# Patient Record
Sex: Female | Born: 1985 | Race: White | Hispanic: No | Marital: Married | Smoking: Never smoker
Health system: Southern US, Community
[De-identification: ages and names within clinical notes are randomized; demographics above are authoritative.]

## PROBLEM LIST (undated history)

## (undated) ENCOUNTER — Inpatient Hospital Stay (HOSPITAL_COMMUNITY): Payer: Self-pay

## (undated) DIAGNOSIS — N979 Female infertility, unspecified: Secondary | ICD-10-CM

## (undated) DIAGNOSIS — N83209 Unspecified ovarian cyst, unspecified side: Secondary | ICD-10-CM

## (undated) DIAGNOSIS — C439 Malignant melanoma of skin, unspecified: Secondary | ICD-10-CM

## (undated) DIAGNOSIS — R87629 Unspecified abnormal cytological findings in specimens from vagina: Secondary | ICD-10-CM

## (undated) DIAGNOSIS — T7840XA Allergy, unspecified, initial encounter: Secondary | ICD-10-CM

## (undated) HISTORY — DX: Allergy, unspecified, initial encounter: T78.40XA

## (undated) HISTORY — DX: Malignant melanoma of skin, unspecified: C43.9

## (undated) HISTORY — PX: NO PAST SURGERIES: SHX2092

---

## 2007-12-24 ENCOUNTER — Emergency Department (HOSPITAL_COMMUNITY): Admission: EM | Admit: 2007-12-24 | Discharge: 2007-12-25 | Payer: Self-pay | Admitting: Emergency Medicine

## 2012-06-29 ENCOUNTER — Ambulatory Visit (INDEPENDENT_AMBULATORY_CARE_PROVIDER_SITE_OTHER): Payer: BC Managed Care – PPO | Admitting: Physician Assistant

## 2012-06-29 VITALS — BP 132/86 | HR 103 | Temp 98.7°F | Resp 16 | Ht 65.0 in | Wt 231.8 lb

## 2012-06-29 DIAGNOSIS — J029 Acute pharyngitis, unspecified: Secondary | ICD-10-CM

## 2012-06-29 DIAGNOSIS — J020 Streptococcal pharyngitis: Secondary | ICD-10-CM

## 2012-06-29 DIAGNOSIS — J309 Allergic rhinitis, unspecified: Secondary | ICD-10-CM

## 2012-06-29 MED ORDER — AMOXICILLIN 875 MG PO TABS: 875.0000 mg | ORAL_TABLET | Freq: Two times a day (BID) | ORAL | Status: DC

## 2012-06-29 NOTE — Patient Instructions (Addendum)
Ibuprofen 800 mg three times daily with food and/or Acetaminophen 325 mg every 4-6 hours can help with the muscle aches and throat pain. You are contagious until you have been on the medication for 24 hours.  Please take two doses today. Get plenty of rest and drink at least 64 ounces of water daily.

## 2012-06-29 NOTE — Progress Notes (Signed)
  Subjective:    Patient ID: Dana Hatfield, female    DOB: 04/06/1985, 27 y.o.   MRN: [id number]  HPI This 27 y.o. female presents for evaluation of respiratory illness.  She started with a scratchy throat, but has progressed to very sore throat.  Cough is non-productive, and makes the throat pain worse.  Nasal/sinus congestions, pressure and drainge.  Subjective fever, chills.  No GI/GU symptoms.  Feels achey all over. Nasal drainage is clear.   Past Medical History  Diagnosis Date  . Allergy     History reviewed. No pertinent past surgical history.  Prior to Admission medications   Medication Sig Start Date End Date Taking? Authorizing Provider  Norgestimate-Ethinyl Estradiol Triphasic (ORTHO TRI-CYCLEN, 28,) 0.18/0.215/0.25 MG-35 MCG tablet Take 1 tablet by mouth daily.   Yes Historical Provider, MD    No Known Allergies  History   Social History  . Marital Status: Single    Spouse Name: n/a    Number of Children: 0  . Years of Education: 16   Occupational History  . teacher    Social History Main Topics  . Smoking status: Never Smoker   . Smokeless tobacco: Not on file  . Alcohol Use: No  . Drug Use: No  . Sexually Active: Yes    Birth Control/ Protection: Pill    Family History  Problem Relation Age of Onset  . Parkinson's disease Mother   . Arthritis/Rheumatoid Mother   . Ovarian cancer Maternal Grandmother   . Ovarian cancer Paternal Grandmother       Review of Systems As above.    Objective:   Physical Exam  Blood pressure 132/86, pulse 103, temperature 98.7 F (37.1 C), temperature source Oral, resp. rate 16, height 5\' 5"  (1.651 m), weight 231 lb 12.8 oz (105.144 kg), last menstrual period 06/16/2012, SpO2 100.00%. Body mass index is 38.57 kg/(m^2). Well-developed, well nourished WF who is awake, alert and oriented, in NAD. HEENT: [place]/AT, PERRL, EOMI.  Sclera and conjunctiva are clear.  EAC are patent, TMs are normal in appearance. Nasal mucosa is  pink and moist, though a little congested.  Clear rhinorrhea noted. OP is erythematous, but otherwise clear. Neck: supple, thyromegaly. Tender shotty lymphadenopathy. Heart: RRR, no murmur Lungs: normal effort, CTA Extremities: no cyanosis, clubbing or edema. Skin: warm and dry without rash. Psychologic: good mood and appropriate affect, normal speech and behavior.  Results for orders placed in visit on 06/29/12  POCT RAPID STREP A (OFFICE)      Result Value Range   Rapid Strep A Screen Positive (*) Negative      Assessment & Plan:  Acute pharyngitis - Plan: POCT rapid strep A  Allergic rhinitis  Strep pharyngitis - Plan: amoxicillin (AMOXIL) 875 MG tablet  Fernande Bras, PA-C Physician Assistant-Certified [facility]

## 2014-03-13 DIAGNOSIS — C439 Malignant melanoma of skin, unspecified: Secondary | ICD-10-CM

## 2014-03-13 HISTORY — DX: Malignant melanoma of skin, unspecified: C43.9

## 2014-08-17 ENCOUNTER — Other Ambulatory Visit (HOSPITAL_COMMUNITY): Payer: Self-pay | Admitting: Gynecology

## 2014-08-17 DIAGNOSIS — Z3141 Encounter for fertility testing: Secondary | ICD-10-CM

## 2014-08-25 ENCOUNTER — Ambulatory Visit (HOSPITAL_COMMUNITY)
Admission: RE | Admit: 2014-08-25 | Discharge: 2014-08-25 | Disposition: A | Discharge status: Home / Self Care | Payer: BC Managed Care – PPO | Source: Ambulatory Visit | Attending: Gynecology | Admitting: Gynecology

## 2014-08-25 DIAGNOSIS — Z3141 Encounter for fertility testing: Secondary | ICD-10-CM

## 2014-08-25 DIAGNOSIS — N979 Female infertility, unspecified: Secondary | ICD-10-CM | POA: Diagnosis not present

## 2014-08-25 MED ORDER — IOHEXOL 300 MG/ML  SOLN
20.0000 mL | Freq: Once | INTRAMUSCULAR | Status: AC | PRN
  Administered 2014-08-25: 20 mL

## 2014-08-25 MED ORDER — IOHEXOL 300 MG/ML  SOLN
30.0000 mL | Freq: Once | INTRAMUSCULAR | Status: AC | PRN
  Administered 2014-08-25: 30 mL

## 2014-11-01 ENCOUNTER — Ambulatory Visit
Admission: EM | Admit: 2014-11-01 | Discharge: 2014-11-01 | Disposition: A | Discharge status: Home / Self Care | Payer: BC Managed Care – PPO | Attending: Family Medicine | Admitting: Family Medicine

## 2014-11-01 ENCOUNTER — Encounter: Payer: Self-pay | Admitting: Gynecology

## 2014-11-01 DIAGNOSIS — M62830 Muscle spasm of back: Secondary | ICD-10-CM

## 2014-11-01 DIAGNOSIS — S39012A Strain of muscle, fascia and tendon of lower back, initial encounter: Secondary | ICD-10-CM

## 2014-11-01 MED ORDER — NAPROXEN 500 MG PO TABS: 500.0000 mg | ORAL_TABLET | Freq: Two times a day (BID) | ORAL | Status: DC

## 2014-11-01 MED ORDER — CYCLOBENZAPRINE HCL 5 MG PO TABS: 5.0000 mg | ORAL_TABLET | Freq: Two times a day (BID) | ORAL | Status: DC | PRN

## 2014-11-01 NOTE — ED Notes (Signed)
Patient stated x this am while putting clothes in laundry basket twisted lower back and now extremely painful.

## 2014-11-01 NOTE — ED Provider Notes (Signed)
Patient presents today with symptoms of lower back pain. She states that she was putting clothes in laundry basket when she twisted her lower back. She has had this happen before but has usually resolved with Tylenol and time. She took 2 Tylenol after it happened but her symptoms have persisted. She denies any radicular symptoms, incontinence, foot drop, weakness of lower extremities. She denies any known history of any discogenic problems.  ROS: Negative except mentioned above. Vitals as per Epic.   GENERAL: NAD RESP: CTA B CARD: RRR MSK: no midline tenderness, mild generalized lumbar paravertebral tenderness bilaterally, decreased ROM in all directions, -SLR, 5/5 strength of LEs, nv intact. NEURO: CN II-XII groslly intact   A/P: Lower Back Strain/Spasm-will treat patient with Naprosyn, Flexeril, ice when necessary, if symptoms do persist or worsen I would recommend that patient seek medical attention for imaging, etc. Patient addresses understanding of plan. My of instructed her not to do any heavy lifting or any repetitive motions with her back.   Paulina Fusi, MD 11/01/14 [phone]

## 2015-01-12 LAB — OB RESULTS CONSOLE ABO/RH: RH Type: POSITIVE

## 2015-01-12 LAB — OB RESULTS CONSOLE GBS: STREP GROUP B AG: NEGATIVE

## 2015-01-12 LAB — OB RESULTS CONSOLE HEPATITIS B SURFACE ANTIGEN: Hepatitis B Surface Ag: NEGATIVE

## 2015-01-12 LAB — OB RESULTS CONSOLE GC/CHLAMYDIA
Chlamydia: NEGATIVE
GC PROBE AMP, GENITAL: NEGATIVE

## 2015-01-12 LAB — OB RESULTS CONSOLE ANTIBODY SCREEN: Antibody Screen: NEGATIVE

## 2015-01-12 LAB — OB RESULTS CONSOLE HIV ANTIBODY (ROUTINE TESTING): HIV: NONREACTIVE

## 2015-01-12 LAB — OB RESULTS CONSOLE RPR: RPR: NONREACTIVE

## 2015-01-12 LAB — OB RESULTS CONSOLE RUBELLA ANTIBODY, IGM: Rubella: IMMUNE

## 2015-03-14 NOTE — L&D Delivery Note (Signed)
SVD of VFI at 1935 on 08/10/15.  APGARs 9,9.  EBL 350cc.  Placenta to pathology.   Head delivered ROA and body followed atraumatically.  Baby to abdomen.  Cord was clamped and cut.  Cord blood was obtained.  Placenta delivered S/I/3VC.  Fundus was firmed with pitocin and massage.  Bilateral vaginal sulcus lacs were repaired with 3-0 Rapide in the normal fashion.  2nd degree perineal and right periurethral lacs were repaired with 3-0 Rapide in the normal fashion.  Mom and baby stable.   Linda Hedges, DO

## 2015-05-25 ENCOUNTER — Other Ambulatory Visit (HOSPITAL_COMMUNITY): Payer: Self-pay | Admitting: Obstetrics & Gynecology

## 2015-05-25 DIAGNOSIS — Z0489 Encounter for examination and observation for other specified reasons: Secondary | ICD-10-CM

## 2015-05-25 DIAGNOSIS — Z3A29 29 weeks gestation of pregnancy: Secondary | ICD-10-CM

## 2015-05-25 DIAGNOSIS — IMO0002 Reserved for concepts with insufficient information to code with codable children: Secondary | ICD-10-CM

## 2015-05-26 ENCOUNTER — Ambulatory Visit (HOSPITAL_COMMUNITY): Payer: BC Managed Care – PPO

## 2015-05-27 ENCOUNTER — Ambulatory Visit (HOSPITAL_COMMUNITY)
Admission: RE | Admit: 2015-05-27 | Discharge: 2015-05-27 | Disposition: A | Discharge status: Home / Self Care | Payer: BC Managed Care – PPO | Source: Ambulatory Visit | Attending: Obstetrics & Gynecology | Admitting: Obstetrics & Gynecology

## 2015-05-27 DIAGNOSIS — Z0489 Encounter for examination and observation for other specified reasons: Secondary | ICD-10-CM

## 2015-05-27 DIAGNOSIS — Z3A29 29 weeks gestation of pregnancy: Secondary | ICD-10-CM

## 2015-05-27 DIAGNOSIS — Z36 Encounter for antenatal screening of mother: Secondary | ICD-10-CM | POA: Diagnosis not present

## 2015-05-27 DIAGNOSIS — IMO0002 Reserved for concepts with insufficient information to code with codable children: Secondary | ICD-10-CM

## 2015-06-04 ENCOUNTER — Other Ambulatory Visit (HOSPITAL_COMMUNITY): Payer: Self-pay

## 2015-06-04 ENCOUNTER — Encounter (HOSPITAL_COMMUNITY): Payer: Self-pay

## 2015-06-29 ENCOUNTER — Inpatient Hospital Stay (HOSPITAL_COMMUNITY)
Admission: AD | Admit: 2015-06-29 | Discharge: 2015-06-29 | Disposition: A | Discharge status: Home / Self Care | Payer: Worker's Compensation | Source: Ambulatory Visit | Attending: Obstetrics and Gynecology | Admitting: Obstetrics and Gynecology

## 2015-06-29 ENCOUNTER — Encounter (HOSPITAL_COMMUNITY): Payer: Self-pay | Admitting: *Deleted

## 2015-06-29 DIAGNOSIS — O9A213 Injury, poisoning and certain other consequences of external causes complicating pregnancy, third trimester: Secondary | ICD-10-CM

## 2015-06-29 DIAGNOSIS — R109 Unspecified abdominal pain: Secondary | ICD-10-CM | POA: Insufficient documentation

## 2015-06-29 DIAGNOSIS — O368130 Decreased fetal movements, third trimester, not applicable or unspecified: Secondary | ICD-10-CM

## 2015-06-29 DIAGNOSIS — O26893 Other specified pregnancy related conditions, third trimester: Secondary | ICD-10-CM | POA: Diagnosis not present

## 2015-06-29 DIAGNOSIS — W500XXA Accidental hit or strike by another person, initial encounter: Secondary | ICD-10-CM | POA: Diagnosis not present

## 2015-06-29 DIAGNOSIS — S3981XA Other specified injuries of abdomen, initial encounter: Secondary | ICD-10-CM | POA: Diagnosis not present

## 2015-06-29 DIAGNOSIS — T1490 Injury, unspecified: Secondary | ICD-10-CM

## 2015-06-29 DIAGNOSIS — Z3A33 33 weeks gestation of pregnancy: Secondary | ICD-10-CM | POA: Diagnosis not present

## 2015-06-29 HISTORY — DX: Unspecified abnormal cytological findings in specimens from vagina: R87.629

## 2015-06-29 HISTORY — DX: Unspecified ovarian cyst, unspecified side: N83.209

## 2015-06-29 HISTORY — DX: Female infertility, unspecified: N97.9

## 2015-06-29 LAB — KLEIHAUER-BETKE STAIN
# Vials RhIg: 1
Fetal Cells %: 0 %
Quantitation Fetal Hemoglobin: 0 mL

## 2015-06-29 MED ORDER — NIFEDIPINE 10 MG PO CAPS
10.0000 mg | ORAL_CAPSULE | Freq: Once | ORAL | Status: AC
  Administered 2015-06-29: 10 mg via ORAL
  Filled 2015-06-29: qty 1

## 2015-06-29 MED ORDER — NIFEDIPINE 10 MG PO CAPS
10.0000 mg | ORAL_CAPSULE | Freq: Once | ORAL | Status: DC
  Filled 2015-06-29: qty 1
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
## 2015-06-29 NOTE — MAU Note (Signed)
Pt back to use restroom, then taken directly to rm
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
## 2015-06-29 NOTE — Discharge Instructions (Signed)
Vaginal Bleeding During Pregnancy, Third Trimester A small amount of bleeding (spotting) from the vagina is relatively common in pregnancy. Various things can cause bleeding or spotting in pregnancy. Sometimes the bleeding is normal and is not a problem. However, bleeding during the third trimester can also be a sign of something serious for the mother and the baby. Be sure to tell your health care provider about any vaginal bleeding right away.  Some possible causes of vaginal bleeding during the third trimester include:   The placenta may be partially or completely covering the opening to the cervix (placenta previa).   The placenta may have separated from the uterus (abruption of the placenta).   There may be an infection or growth on the cervix.   You may be starting labor, called discharging of the mucus plug.   The placenta may grow into the muscle layer of the uterus (placenta accreta).  HOME CARE INSTRUCTIONS  Watch your condition for any changes. The following actions may help to lessen any discomfort you are feeling:   Follow your health care provider's instructions for limiting your activity. If your health care provider orders bed rest, you may need to stay in bed and only get up to use the bathroom. However, your health care provider may allow you to continue light activity.  If needed, make plans for someone to help with your regular activities and responsibilities while you are on bed rest.  Keep track of the number of pads you use each day, how often you change pads, and how soaked (saturated) they are. Write this down.  Do not use tampons. Do not douche.  Do not have sexual intercourse or orgasms until approved by your health care provider.  Follow your health care provider's advice about lifting, driving, and physical activities.  If you pass any tissue from your vagina, save the tissue so you can show it to your health care provider.   Only take over-the-counter  or prescription medicines as directed by your health care provider.  Do not take aspirin because it can make you bleed.   Keep all follow-up appointments as directed by your health care provider. SEEK MEDICAL CARE IF:  You have any vaginal bleeding during any part of your pregnancy.  You have cramps or labor pains.  You have a fever, not controlled by medicine. SEEK IMMEDIATE MEDICAL CARE IF:   You have severe cramps or pain in your back or belly (abdomen).  You have chills.  You have a gush of fluid from the vagina.  You pass large clots or tissue from your vagina.  Your bleeding increases.  You feel light-headed or weak.  You pass out.  You feel less movement or no movement of the baby.  MAKE SURE YOU:  Understand these instructions.  Will watch your condition.  Will get help right away if you are not doing well or get worse.   This information is not intended to replace advice given to you by your health care provider. Make sure you discuss any questions you have with your health care provider.   Document Released: 05/20/2002 Document Revised: 03/04/2013 Document Reviewed: 11/04/2012 Elsevier Interactive Patient Education [employer].

## 2015-06-29 NOTE — MAU Note (Signed)
Urine sent to lab 

## 2015-07-01 ENCOUNTER — Encounter (HOSPITAL_COMMUNITY): Payer: Self-pay

## 2015-07-15 LAB — OB RESULTS CONSOLE GBS: GBS: NEGATIVE

## 2015-08-05 ENCOUNTER — Telehealth (HOSPITAL_COMMUNITY): Payer: Self-pay | Admitting: *Deleted

## 2015-08-05 ENCOUNTER — Encounter (HOSPITAL_COMMUNITY): Payer: Self-pay | Admitting: *Deleted

## 2015-08-05 NOTE — Telephone Encounter (Signed)
Preadmission screen  

## 2015-08-09 ENCOUNTER — Encounter (HOSPITAL_COMMUNITY): Payer: Self-pay

## 2015-08-09 ENCOUNTER — Inpatient Hospital Stay (HOSPITAL_COMMUNITY)
Admission: AD | Admit: 2015-08-09 | Discharge: 2015-08-12 | DRG: 775 | Disposition: A | Discharge status: Home / Self Care | Payer: BC Managed Care – PPO | Source: Ambulatory Visit | Attending: Obstetrics & Gynecology | Admitting: Obstetrics & Gynecology

## 2015-08-09 DIAGNOSIS — Z808 Family history of malignant neoplasm of other organs or systems: Secondary | ICD-10-CM

## 2015-08-09 DIAGNOSIS — O4202 Full-term premature rupture of membranes, onset of labor within 24 hours of rupture: Secondary | ICD-10-CM | POA: Diagnosis present

## 2015-08-09 DIAGNOSIS — Z8261 Family history of arthritis: Secondary | ICD-10-CM | POA: Diagnosis not present

## 2015-08-09 DIAGNOSIS — Z8249 Family history of ischemic heart disease and other diseases of the circulatory system: Secondary | ICD-10-CM | POA: Diagnosis not present

## 2015-08-09 DIAGNOSIS — O99214 Obesity complicating childbirth: Secondary | ICD-10-CM | POA: Diagnosis present

## 2015-08-09 DIAGNOSIS — Z6841 Body Mass Index (BMI) 40.0 and over, adult: Secondary | ICD-10-CM

## 2015-08-09 DIAGNOSIS — Z3490 Encounter for supervision of normal pregnancy, unspecified, unspecified trimester: Secondary | ICD-10-CM

## 2015-08-09 DIAGNOSIS — Z8041 Family history of malignant neoplasm of ovary: Secondary | ICD-10-CM | POA: Diagnosis not present

## 2015-08-09 DIAGNOSIS — Z820 Family history of epilepsy and other diseases of the nervous system: Secondary | ICD-10-CM | POA: Diagnosis not present

## 2015-08-09 DIAGNOSIS — Z3A39 39 weeks gestation of pregnancy: Secondary | ICD-10-CM | POA: Diagnosis not present

## 2015-08-09 DIAGNOSIS — O9A213 Injury, poisoning and certain other consequences of external causes complicating pregnancy, third trimester: Secondary | ICD-10-CM

## 2015-08-09 LAB — CBC
HCT: 34.2 % — ABNORMAL LOW (ref 36.0–46.0)
Hemoglobin: 11.8 g/dL — ABNORMAL LOW (ref 12.0–15.0)
MCH: 30.8 pg (ref 26.0–34.0)
MCHC: 34.5 g/dL (ref 30.0–36.0)
MCV: 89.3 fL (ref 78.0–100.0)
PLATELETS: 348 10*3/uL (ref 150–400)
RBC: 3.83 MIL/uL — ABNORMAL LOW (ref 3.87–5.11)
RDW: 13.7 % (ref 11.5–15.5)
WBC: 11.3 10*3/uL — ABNORMAL HIGH (ref 4.0–10.5)

## 2015-08-09 LAB — POCT FERN TEST: POCT FERN TEST: POSITIVE

## 2015-08-09 MED ORDER — OXYTOCIN BOLUS FROM INFUSION
500.0000 mL | INTRAVENOUS | Status: DC
  Administered 2015-08-10: 500 mL via INTRAVENOUS

## 2015-08-09 MED ORDER — FLEET ENEMA 7-19 GM/118ML RE ENEM: 1.0000 | ENEMA | RECTAL | Status: DC | PRN

## 2015-08-09 MED ORDER — SOD CITRATE-CITRIC ACID 500-334 MG/5ML PO SOLN
30.0000 mL | ORAL | Status: DC | PRN
Start: 2015-08-09 — End: 2015-08-10

## 2015-08-09 MED ORDER — OXYCODONE-ACETAMINOPHEN 5-325 MG PO TABS: 2.0000 | ORAL_TABLET | ORAL | Status: DC | PRN

## 2015-08-09 MED ORDER — ACETAMINOPHEN 325 MG PO TABS: 650.0000 mg | ORAL_TABLET | ORAL | Status: DC | PRN

## 2015-08-09 MED ORDER — LACTATED RINGERS IV SOLN
INTRAVENOUS | Status: DC
  Administered 2015-08-09 – 2015-08-10 (×3): via INTRAVENOUS

## 2015-08-09 MED ORDER — ONDANSETRON HCL 4 MG/2ML IJ SOLN: 4.0000 mg | Freq: Four times a day (QID) | INTRAMUSCULAR | Status: DC | PRN

## 2015-08-09 MED ORDER — LIDOCAINE HCL (PF) 1 % IJ SOLN
30.0000 mL | INTRAMUSCULAR | Status: DC | PRN
  Administered 2015-08-10: 30 mL via SUBCUTANEOUS
  Filled 2015-08-09: qty 30

## 2015-08-09 MED ORDER — OXYCODONE-ACETAMINOPHEN 5-325 MG PO TABS: 1.0000 | ORAL_TABLET | ORAL | Status: DC | PRN

## 2015-08-09 MED ORDER — LACTATED RINGERS IV SOLN: 500.0000 mL | INTRAVENOUS | Status: DC | PRN

## 2015-08-09 MED ORDER — OXYTOCIN 40 UNITS IN LACTATED RINGERS INFUSION - SIMPLE MED
2.5000 [IU]/h | INTRAVENOUS | Status: DC
  Filled 2015-08-09: qty 1000

## 2015-08-09 NOTE — MAU Note (Signed)
Routine labor admission orders given with option to have epidural whenever. Continue monitoring BP's. No other orders given at this time.

## 2015-08-09 NOTE — MAU Note (Signed)
Pt reports ROM at 2045, some contractions.

## 2015-08-10 ENCOUNTER — Inpatient Hospital Stay (HOSPITAL_COMMUNITY): Payer: BC Managed Care – PPO | Admitting: Anesthesiology

## 2015-08-10 ENCOUNTER — Encounter (HOSPITAL_COMMUNITY): Payer: Self-pay | Admitting: *Deleted

## 2015-08-10 DIAGNOSIS — Z3490 Encounter for supervision of normal pregnancy, unspecified, unspecified trimester: Secondary | ICD-10-CM

## 2015-08-10 LAB — COMPREHENSIVE METABOLIC PANEL
ALBUMIN: 2.9 g/dL — AB (ref 3.5–5.0)
ALK PHOS: 122 U/L (ref 38–126)
ALT: 15 U/L (ref 14–54)
AST: 16 U/L (ref 15–41)
Anion gap: 8 (ref 5–15)
BUN: 11 mg/dL (ref 6–20)
CALCIUM: 9 mg/dL (ref 8.9–10.3)
CO2: 22 mmol/L (ref 22–32)
CREATININE: 0.55 mg/dL (ref 0.44–1.00)
Chloride: 109 mmol/L (ref 101–111)
Glucose, Bld: 101 mg/dL — ABNORMAL HIGH (ref 65–99)
Potassium: 4.2 mmol/L (ref 3.5–5.1)
SODIUM: 139 mmol/L (ref 135–145)
Total Bilirubin: 0.6 mg/dL (ref 0.3–1.2)
Total Protein: 6.3 g/dL — ABNORMAL LOW (ref 6.5–8.1)

## 2015-08-10 LAB — CBC
HCT: 33.8 % — ABNORMAL LOW (ref 36.0–46.0)
Hemoglobin: 11.4 g/dL — ABNORMAL LOW (ref 12.0–15.0)
MCH: 30.2 pg (ref 26.0–34.0)
MCHC: 33.7 g/dL (ref 30.0–36.0)
MCV: 89.7 fL (ref 78.0–100.0)
PLATELETS: 289 10*3/uL (ref 150–400)
RBC: 3.77 MIL/uL — AB (ref 3.87–5.11)
RDW: 13.8 % (ref 11.5–15.5)
WBC: 11.7 10*3/uL — AB (ref 4.0–10.5)

## 2015-08-10 LAB — TYPE AND SCREEN
ABO/RH(D): O POS
Antibody Screen: NEGATIVE

## 2015-08-10 LAB — ABO/RH: ABO/RH(D): O POS

## 2015-08-10 MED ORDER — FENTANYL 2.5 MCG/ML BUPIVACAINE 1/10 % EPIDURAL INFUSION (WH - ANES)
14.0000 mL/h | INTRAMUSCULAR | Status: DC | PRN
  Administered 2015-08-10 (×2): 14 mL/h via EPIDURAL
  Filled 2015-08-10 (×2): qty 125

## 2015-08-10 MED ORDER — BUTORPHANOL TARTRATE 1 MG/ML IJ SOLN
1.0000 mg | INTRAMUSCULAR | Status: DC | PRN
  Administered 2015-08-10: 1 mg via INTRAVENOUS
  Filled 2015-08-10: qty 1

## 2015-08-10 MED ORDER — ACETAMINOPHEN 325 MG PO TABS
650.0000 mg | ORAL_TABLET | ORAL | Status: DC | PRN
  Administered 2015-08-11 – 2015-08-12 (×2): 650 mg via ORAL
  Filled 2015-08-10 (×2): qty 2

## 2015-08-10 MED ORDER — OXYCODONE-ACETAMINOPHEN 5-325 MG PO TABS
1.0000 | ORAL_TABLET | ORAL | Status: DC | PRN
  Administered 2015-08-10 – 2015-08-11 (×2): 1 via ORAL
  Filled 2015-08-10 (×2): qty 1

## 2015-08-10 MED ORDER — DIBUCAINE 1 % RE OINT: 1.0000 "application " | TOPICAL_OINTMENT | RECTAL | Status: DC | PRN

## 2015-08-10 MED ORDER — PHENYLEPHRINE 40 MCG/ML (10ML) SYRINGE FOR IV PUSH (FOR BLOOD PRESSURE SUPPORT)
80.0000 ug | PREFILLED_SYRINGE | INTRAVENOUS | Status: DC | PRN
  Filled 2015-08-10: qty 5

## 2015-08-10 MED ORDER — BENZOCAINE-MENTHOL 20-0.5 % EX AERO
1.0000 "application " | INHALATION_SPRAY | CUTANEOUS | Status: DC | PRN
  Administered 2015-08-10: 1 via TOPICAL
  Filled 2015-08-10: qty 56

## 2015-08-10 MED ORDER — ONDANSETRON HCL 4 MG/2ML IJ SOLN: 4.0000 mg | INTRAMUSCULAR | Status: DC | PRN

## 2015-08-10 MED ORDER — WITCH HAZEL-GLYCERIN EX PADS: 1.0000 "application " | MEDICATED_PAD | CUTANEOUS | Status: DC | PRN

## 2015-08-10 MED ORDER — OXYTOCIN 40 UNITS IN LACTATED RINGERS INFUSION - SIMPLE MED
1.0000 m[IU]/min | INTRAVENOUS | Status: DC
  Administered 2015-08-10: 1 m[IU]/min via INTRAVENOUS

## 2015-08-10 MED ORDER — IBUPROFEN 600 MG PO TABS
600.0000 mg | ORAL_TABLET | Freq: Four times a day (QID) | ORAL | Status: DC
  Administered 2015-08-10 – 2015-08-12 (×6): 600 mg via ORAL
  Filled 2015-08-10 (×7): qty 1

## 2015-08-10 MED ORDER — SENNOSIDES-DOCUSATE SODIUM 8.6-50 MG PO TABS
2.0000 | ORAL_TABLET | ORAL | Status: DC
  Administered 2015-08-11 (×2): 2 via ORAL
  Filled 2015-08-10 (×2): qty 2

## 2015-08-10 MED ORDER — TERBUTALINE SULFATE 1 MG/ML IJ SOLN
0.2500 mg | Freq: Once | INTRAMUSCULAR | Status: DC | PRN
  Filled 2015-08-10: qty 1

## 2015-08-10 MED ORDER — SODIUM BICARBONATE 8.4 % IV SOLN
INTRAVENOUS | Status: DC | PRN
  Administered 2015-08-10: 5 mL via EPIDURAL

## 2015-08-10 MED ORDER — DIPHENHYDRAMINE HCL 50 MG/ML IJ SOLN: 12.5000 mg | INTRAMUSCULAR | Status: DC | PRN

## 2015-08-10 MED ORDER — EPHEDRINE 5 MG/ML INJ
10.0000 mg | INTRAVENOUS | Status: DC | PRN
  Filled 2015-08-10: qty 2

## 2015-08-10 MED ORDER — SIMETHICONE 80 MG PO CHEW
80.0000 mg | CHEWABLE_TABLET | ORAL | Status: DC | PRN
  Administered 2015-08-11: 80 mg via ORAL
  Filled 2015-08-10: qty 1

## 2015-08-10 MED ORDER — OXYTOCIN 40 UNITS IN LACTATED RINGERS INFUSION - SIMPLE MED: 1.0000 m[IU]/min | INTRAVENOUS | Status: DC

## 2015-08-10 MED ORDER — LIDOCAINE HCL (PF) 1 % IJ SOLN
INTRAMUSCULAR | Status: DC | PRN
  Administered 2015-08-10: 2 mL via EPIDURAL
  Administered 2015-08-10: 5 mL via EPIDURAL
  Administered 2015-08-10: 3 mL via EPIDURAL

## 2015-08-10 MED ORDER — LACTATED RINGERS IV SOLN
500.0000 mL | Freq: Once | INTRAVENOUS | Status: AC
  Administered 2015-08-10: 500 mL via INTRAVENOUS

## 2015-08-10 MED ORDER — LACTATED RINGERS IV SOLN: 500.0000 mL | Freq: Once | INTRAVENOUS | Status: DC

## 2015-08-10 MED ORDER — DIPHENHYDRAMINE HCL 25 MG PO CAPS: 25.0000 mg | ORAL_CAPSULE | Freq: Four times a day (QID) | ORAL | Status: DC | PRN

## 2015-08-10 MED ORDER — PRENATAL MULTIVITAMIN CH
1.0000 | ORAL_TABLET | Freq: Every day | ORAL | Status: DC
  Administered 2015-08-11: 1 via ORAL
  Filled 2015-08-10 (×3): qty 1

## 2015-08-10 MED ORDER — ONDANSETRON HCL 4 MG PO TABS: 4.0000 mg | ORAL_TABLET | ORAL | Status: DC | PRN

## 2015-08-10 MED ORDER — COCONUT OIL OIL
1.0000 "application " | TOPICAL_OIL | Status: DC | PRN
  Filled 2015-08-10: qty 120

## 2015-08-10 MED ORDER — PHENYLEPHRINE 40 MCG/ML (10ML) SYRINGE FOR IV PUSH (FOR BLOOD PRESSURE SUPPORT)
80.0000 ug | PREFILLED_SYRINGE | INTRAVENOUS | Status: DC | PRN
  Filled 2015-08-10: qty 10
  Filled 2015-08-10: qty 5

## 2015-08-10 MED ORDER — TETANUS-DIPHTH-ACELL PERTUSSIS 5-2.5-18.5 LF-MCG/0.5 IM SUSP: 0.5000 mL | Freq: Once | INTRAMUSCULAR | Status: DC

## 2015-08-10 MED ORDER — ZOLPIDEM TARTRATE 5 MG PO TABS: 5.0000 mg | ORAL_TABLET | Freq: Every evening | ORAL | Status: DC | PRN

## 2015-08-10 MED ORDER — OXYCODONE-ACETAMINOPHEN 5-325 MG PO TABS: 2.0000 | ORAL_TABLET | ORAL | Status: DC | PRN

## 2015-08-10 NOTE — Anesthesia Postprocedure Evaluation (Signed)
Anesthesia Post Note  Patient: Dana Hatfield  Procedure(s) Performed: * No procedures listed *  Patient location during evaluation: Mother Baby Anesthesia Type: Epidural Level of consciousness: awake and alert Pain management: satisfactory to patient Vital Signs Assessment: post-procedure vital signs reviewed and stable Respiratory status: respiratory function stable Cardiovascular status: stable Postop Assessment: no headache, no backache, epidural receding, patient able to bend at knees, no signs of nausea or vomiting and adequate PO intake Anesthetic complications: no Comments: Comfort level was assessed by AnesthesiaTeam and the patient was pleased with the care, interventions, and services provided by the Department of Anesthesia.     Last Vitals:  Filed Vitals:   08/10/15 2109 08/10/15 2225  BP: 138/80 135/77  Pulse: 123 119  Temp: 37.5 C 37.1 C  Resp: 20 20    Last Pain:  Filed Vitals:   08/10/15 2227  PainSc: 4    Pain Goal: Patients Stated Pain Goal: 3 (08/10/15 0815)               Katherina Mires

## 2015-08-10 NOTE — Anesthesia Preprocedure Evaluation (Signed)
Anesthesia Evaluation  Patient identified by MRN, date of birth, ID band Patient awake    Reviewed: Allergy & Precautions, NPO status , Patient's Chart, lab work & pertinent test results  Airway Mallampati: III  TM Distance: >3 FB Neck ROM: Full    Dental  (+) Teeth Intact, Dental Advisory Given   Pulmonary neg pulmonary ROS,    Pulmonary exam normal breath sounds clear to auscultation       Cardiovascular negative cardio ROS Normal cardiovascular exam Rhythm:Regular Rate:Normal     Neuro/Psych negative neurological ROS  negative psych ROS   GI/Hepatic negative GI ROS, Neg liver ROS,   Endo/Other  Morbid obesity  Renal/GU negative Renal ROS     Musculoskeletal negative musculoskeletal ROS (+)   Abdominal   Peds  Hematology  (+) Blood dyscrasia, anemia , Plt 289k   Anesthesia Other Findings Day of surgery medications reviewed with the patient.  Reproductive/Obstetrics (+) Pregnancy                             Anesthesia Physical Anesthesia Plan  ASA: III  Anesthesia Plan: Epidural   Post-op Pain Management:    Induction:   Airway Management Planned:   Additional Equipment:   Intra-op Plan:   Post-operative Plan:   Informed Consent: I have reviewed the patients History and Physical, chart, labs and discussed the procedure including the risks, benefits and alternatives for the proposed anesthesia with the patient or authorized representative who has indicated his/her understanding and acceptance.   Dental advisory given  Plan Discussed with:   Anesthesia Plan Comments: (Patient identified. Risks/Benefits/Options discussed with patient including but not limited to bleeding, infection, nerve damage, paralysis, failed block, incomplete pain control, headache, blood pressure changes, nausea, vomiting, reactions to medication both or allergic, itching and postpartum back pain.  Confirmed with bedside nurse the patient's most recent platelet count. Confirmed with patient that they are not currently taking any anticoagulation, have any bleeding history or any family history of bleeding disorders. Patient expressed understanding and wished to proceed. All questions were answered. )        Anesthesia Quick Evaluation

## 2015-08-10 NOTE — H&P (Signed)
Dana Hatfield is a 30 y.o. female presenting for PROM at 2045 last night; light meconium stained fluid.  Patient was started on pitocin and is currently very uncomfortable with CTX; Stadol no help.  No HA, CP/SOB, RUQ pain or visual disturbance.  Antepartum course uncomplicated.  GBS negative.  Maternal Medical History:  Reason for admission: Rupture of membranes.   Contractions: Onset was 6-12 hours ago.   Frequency: irregular.   Perceived severity is moderate.    Fetal activity: Perceived fetal activity is normal.   Last perceived fetal movement was within the past hour.    Prenatal complications: no prenatal complications Prenatal Complications - Diabetes: none.    OB History    Gravida Para Term Preterm AB TAB SAB Ectopic Multiple Living   1 0 0 0 0 0 0 0       Past Medical History  Diagnosis Date  . Allergy   . Vaginal Pap smear, abnormal   . Infertility, female   . Ovarian cyst   . Melanoma ([place]) 2016    atypical nevis removed   Past Surgical History  Procedure Laterality Date  . No past surgeries     Family History: family history includes Arthritis/Rheumatoid in her mother; Cancer in her paternal grandmother; Heart disease in her paternal grandfather; Melanoma in her mother, paternal grandfather, and sister; Ovarian cancer in her maternal grandmother; Parkinson's disease in her mother; Rheum arthritis in her mother. There is no history of Alcohol abuse, Arthritis, Asthma, Birth defects, COPD, Depression, Diabetes, Drug abuse, Early death, Hearing loss, Hyperlipidemia, Hypertension, Kidney disease, Learning disabilities, Mental illness, Mental retardation, Miscarriages / Stillbirths, Stroke, Vision loss, or Varicose Veins. Social History:  reports that she has never smoked. She has never used smokeless tobacco. She reports that she does not drink alcohol or use illicit drugs.   Prenatal Transfer Tool  Maternal Diabetes: No Genetic Screening: Normal Maternal  Ultrasounds/Referrals: Normal Fetal Ultrasounds or other Referrals:  None Maternal Substance Abuse:  No Significant Maternal Medications:  None Significant Maternal Lab Results:  Lab values include: Group B Strep negative Other Comments:  None  ROS  Dilation: 3.5 Effacement (%): 70 Station: -2 Exam by:: L.Mears,RN Blood pressure 153/89, pulse 82, temperature 98 F (36.7 C), temperature source Oral, resp. rate 16, height 5\' 5"  (1.651 m), weight 277 lb (125.646 kg), last menstrual period 10/07/2014, SpO2 99 %. Maternal Exam:  Uterine Assessment: Contraction strength is moderate.  Contraction frequency is regular.   Abdomen: Patient reports no abdominal tenderness. Fundal height is c/w dates.   Estimated fetal weight is 8#8.       Physical Exam  Constitutional: She is oriented to person, place, and time. She appears well-developed and well-nourished.  GI: Soft. There is no rebound and no guarding.  Neurological: She is alert and oriented to person, place, and time.  Skin: Skin is warm and dry.  Psychiatric: She has a normal mood and affect. Her behavior is normal.    Prenatal labs: ABO, Rh: --/--/O POS, O POS (05/29 2325) Antibody: NEG (05/29 2325) Rubella: Immune (11/01 0000) RPR: Nonreactive (11/01 0000)  HBsAg: Negative (11/01 0000)  HIV: Non-reactive (11/01 0000)  GBS: Negative (05/04 0000)   Assessment/Plan: 30yo G1 at [redacted]w[redacted]d with PROM -Continue pitocin augmentation -Epidural -Anticipate NSVD   Andriel Omalley 08/10/2015, 8:01 AM

## 2015-08-10 NOTE — Anesthesia Pain Management Evaluation Note (Signed)
  CRNA Pain Management Visit Note  Patient: Dana Hatfield, 30 y.o., female  "Hello I am a member of the anesthesia team at [facility]. We have an anesthesia team available at all times to provide care throughout the hospital, including epidural management and anesthesia for C-section. I don't know your plan for the delivery whether it a natural birth, water birth, IV sedation, nitrous supplementation, doula or epidural, but we want to meet your pain goals."   1.Was your pain managed to your expectations on prior hospitalizations?   No prior hospitalizations  2.What is your expectation for pain management during this hospitalization?     Epidural  3.How can we help you reach that goal? epidural Record the patient's initial score and the patient's pain goal.   Pain: 0  Pain Goal: 3 [facility] wants you to be able to say your pain was always managed very well.  Casimer Lanius 08/10/2015

## 2015-08-10 NOTE — Anesthesia Procedure Notes (Signed)
Epidural Patient location during procedure: OB  Staffing Anesthesiologist: Catalina Gravel Performed by: anesthesiologist   Preanesthetic Checklist Completed: patient identified, pre-op evaluation, timeout performed, IV checked, risks and benefits discussed and monitors and equipment checked  Epidural Patient position: sitting Prep: DuraPrep Patient monitoring: blood pressure and continuous pulse ox Approach: midline Location: L3-L4 Injection technique: LOR air  Needle:  Needle type: Tuohy  Needle gauge: 17 G Needle length: 9 cm Needle insertion depth: 7 cm Catheter size: 19 Gauge Catheter at skin depth: 12 cm Test dose: negative and Other (1% Lidocaine)  Additional Notes Patient identified.  Risk benefits discussed including failed block, incomplete pain control, headache, nerve damage, paralysis, blood pressure changes, nausea, vomiting, reactions to medication both toxic or allergic, and postpartum back pain.  Patient expressed understanding and wished to proceed.  All questions were answered.  Sterile technique used throughout procedure and epidural site dressed with sterile barrier dressing. No paresthesia or other complications noted. The patient did not experience any signs of intravascular injection such as tinnitus or metallic taste in mouth nor signs of intrathecal spread such as rapid motor block. Please see nursing notes for vital signs. Reason for block:procedure for pain

## 2015-08-11 LAB — CBC
HCT: 26.8 % — ABNORMAL LOW (ref 36.0–46.0)
Hemoglobin: 9.3 g/dL — ABNORMAL LOW (ref 12.0–15.0)
MCH: 30.8 pg (ref 26.0–34.0)
MCHC: 34.7 g/dL (ref 30.0–36.0)
MCV: 88.7 fL (ref 78.0–100.0)
PLATELETS: 256 10*3/uL (ref 150–400)
RBC: 3.02 MIL/uL — ABNORMAL LOW (ref 3.87–5.11)
RDW: 13.8 % (ref 11.5–15.5)
WBC: 20.1 10*3/uL — ABNORMAL HIGH (ref 4.0–10.5)

## 2015-08-11 LAB — RPR: RPR: NONREACTIVE

## 2015-08-11 MED ORDER — FAMOTIDINE 20 MG PO TABS
40.0000 mg | ORAL_TABLET | Freq: Two times a day (BID) | ORAL | Status: DC
  Administered 2015-08-11: 40 mg via ORAL
  Filled 2015-08-11 (×2): qty 2

## 2015-08-11 NOTE — Lactation Note (Signed)
This note was copied from a baby's chart. Lactation Consultation Note New mom has pendulum breast w/everted nipples. Baby is sleeping soundly. [place] visited rm. Several different times, mom sleeping. Hand expression taught w/colostrum noted. Mom encouraged to feed baby 8-12 times/24 hours and with feeding cues. Reviewed Baby & Me book's Breastfeeding Basics.  Educated about newborn behavior, I&O, Cluster feeding, supply and demand. [place] brochure given w/resources, support groups and [place] services. Patient Name: Dana Hatfield [id number] Date: 08/11/2015 Reason for consult: Initial assessment   Maternal Data Has patient been taught Hand Expression?: Yes Does the patient have breastfeeding experience prior to this delivery?: No  Feeding Feeding Type: Breast Fed Length of feed: 60 min (on/off)  LATCH Score/Interventions       Type of Nipple: Everted at rest and after stimulation  Comfort (Breast/Nipple): Soft / non-tender     Intervention(s): Breastfeeding basics reviewed;Support Pillows;Skin to skin;Position options     Lactation Tools Discussed/Used WIC Program: No   Consult Status Consult Status: Follow-up Date: 08/11/15 (in pm) Follow-up type: In-patient    Theodoro Kalata 08/11/2015, 6:55 AM

## 2015-08-11 NOTE — Lactation Note (Addendum)
This note was copied from a baby's chart. Lactation Consultation Note  Patient Name: Dana Hatfield [id number] Date: 08/11/2015 Reason for consult: Follow-up assessment  Infant is nursing very well. Swallowing is evident. Mom taught signs/sound of swallowing. Excellent tongue mobility noted. Mom's questions answered.   Matthias Hughs [facility] 08/11/2015, 7:47 PM

## 2015-08-11 NOTE — Progress Notes (Signed)
Post Partum Day 1 Subjective: no complaints, up ad lib, voiding, tolerating PO and + flatus  Objective: Blood pressure 126/71, pulse 88, temperature 97.5 F (36.4 C), temperature source Oral, resp. rate 18, height 5\' 5"  (1.651 m), weight 277 lb (125.646 kg), last menstrual period 10/07/2014, SpO2 100 %, unknown if currently breastfeeding.  Physical Exam:  General: alert and cooperative Lochia: appropriate Uterine Fundus: firm Incision: healing well, small labial edema, R> L DVT Evaluation: No evidence of DVT seen on physical exam. Negative Homan's sign. No cords or calf tenderness. Calf/Ankle edema is present.   Recent Labs  08/10/15 0819 08/11/15 0555  HGB 11.4* 9.3*  HCT 33.8* 26.8*    Assessment/Plan: Plan for discharge tomorrow   LOS: 2 days   Montie Gelardi G 08/11/2015, 7:51 AM

## 2015-08-12 MED ORDER — ESOMEPRAZOLE MAGNESIUM 40 MG PO CPDR: 40.0000 mg | DELAYED_RELEASE_CAPSULE | Freq: Every day | ORAL | Status: DC

## 2015-08-12 MED ORDER — IBUPROFEN 600 MG PO TABS: 600.0000 mg | ORAL_TABLET | Freq: Four times a day (QID) | ORAL | Status: DC

## 2015-08-12 NOTE — Discharge Summary (Signed)
Obstetric Discharge Summary Reason for Admission: rupture of membranes Prenatal Procedures: ultrasound Intrapartum Procedures: spontaneous vaginal delivery Postpartum Procedures: none Complications-Operative and Postpartum: 2 degree perineal laceration HEMOGLOBIN  Date Value Ref Range Status  08/11/2015 9.3* 12.0 - 15.0 g/dL Final   HCT  Date Value Ref Range Status  08/11/2015 26.8* 36.0 - 46.0 % Final    Physical Exam:  General: alert and cooperative Lochia: appropriate Uterine Fundus: firm Incision: healing well DVT Evaluation: No evidence of DVT seen on physical exam. Negative Homan's sign. No cords or calf tenderness. No significant calf/ankle edema.  Discharge Diagnoses: Term Pregnancy-delivered  Discharge Information: Date: 08/12/2015 Activity: pelvic rest Diet: routine Medications: PNV, Ibuprofen, Colace and Nexium Condition: stable Instructions: refer to practice specific booklet Discharge to: home   Newborn Data: Live born female  Birth Weight: 9 lb 1.3 oz (4120 g) APGAR: 9, 9  Home with mother.  Spurgeon Gancarz G 08/12/2015, 8:57 AM

## 2015-08-12 NOTE — Lactation Note (Signed)
This note was copied from a baby's chart. Lactation Consultation Note  Baby latched upon entering in cradle hold at end of 45 min feeding. Parents state baby has been cluster feeding. Answered questions.  Encouraged breastfeeding on both breasts per feeding. Mother's nipples have small abrasions on tips and mother will apply coconut oil. Encouraged mother to compress/massage breast during feeding and watch for swallows. Mom encouraged to feed baby 8-12 times/24 hours and with feeding cues.  Reviewed engorgement care and monitoring voids/stools.    Patient Name: Dana Hatfield [id number] Date: 08/12/2015 Reason for consult: Follow-up assessment   Maternal Data    Feeding Feeding Type: Breast Fed Length of feed: 45 min  LATCH Score/Interventions Latch: Grasps breast easily, tongue down, lips flanged, rhythmical sucking. Intervention(s): Adjust position  Audible Swallowing: Spontaneous and intermittent  Type of Nipple: Everted at rest and after stimulation  Comfort (Breast/Nipple): Filling, red/small blisters or bruises, mild/mod discomfort  Problem noted: Cracked, bleeding, blisters, bruises Interventions  (Cracked/bleeding/bruising/blister):  (coconut oil)  Hold (Positioning): Assistance needed to correctly position infant at breast and maintain latch.  LATCH Score: 8  Lactation Tools Discussed/Used     Consult Status Consult Status: Complete    Carlye Grippe 08/12/2015, 9:33 AM

## 2015-08-13 ENCOUNTER — Inpatient Hospital Stay (HOSPITAL_COMMUNITY): Admission: RE | Admit: 2015-08-13 | Payer: BC Managed Care – PPO | Source: Ambulatory Visit

## 2015-08-15 ENCOUNTER — Encounter: Payer: Self-pay | Admitting: Emergency Medicine

## 2015-08-15 ENCOUNTER — Observation Stay
Admission: EM | Admit: 2015-08-15 | Discharge: 2015-08-16 | Disposition: A | Discharge status: Home / Self Care | Payer: BC Managed Care – PPO | Attending: Obstetrics and Gynecology | Admitting: Obstetrics and Gynecology

## 2015-08-15 DIAGNOSIS — Z8261 Family history of arthritis: Secondary | ICD-10-CM | POA: Insufficient documentation

## 2015-08-15 DIAGNOSIS — N719 Inflammatory disease of uterus, unspecified: Secondary | ICD-10-CM

## 2015-08-15 DIAGNOSIS — A419 Sepsis, unspecified organism: Secondary | ICD-10-CM

## 2015-08-15 DIAGNOSIS — F419 Anxiety disorder, unspecified: Secondary | ICD-10-CM | POA: Insufficient documentation

## 2015-08-15 DIAGNOSIS — Z85820 Personal history of malignant melanoma of skin: Secondary | ICD-10-CM | POA: Insufficient documentation

## 2015-08-15 DIAGNOSIS — Z800 Family history of malignant neoplasm of digestive organs: Secondary | ICD-10-CM | POA: Diagnosis not present

## 2015-08-15 DIAGNOSIS — Z91040 Latex allergy status: Secondary | ICD-10-CM | POA: Diagnosis not present

## 2015-08-15 DIAGNOSIS — Z808 Family history of malignant neoplasm of other organs or systems: Secondary | ICD-10-CM | POA: Diagnosis not present

## 2015-08-15 DIAGNOSIS — O99345 Other mental disorders complicating the puerperium: Secondary | ICD-10-CM | POA: Insufficient documentation

## 2015-08-15 DIAGNOSIS — Z79899 Other long term (current) drug therapy: Secondary | ICD-10-CM | POA: Insufficient documentation

## 2015-08-15 DIAGNOSIS — D72829 Elevated white blood cell count, unspecified: Secondary | ICD-10-CM | POA: Diagnosis not present

## 2015-08-15 DIAGNOSIS — O9089 Other complications of the puerperium, not elsewhere classified: Principal | ICD-10-CM | POA: Insufficient documentation

## 2015-08-15 DIAGNOSIS — N83209 Unspecified ovarian cyst, unspecified side: Secondary | ICD-10-CM | POA: Diagnosis not present

## 2015-08-15 DIAGNOSIS — Z820 Family history of epilepsy and other diseases of the nervous system: Secondary | ICD-10-CM | POA: Insufficient documentation

## 2015-08-15 DIAGNOSIS — O9081 Anemia of the puerperium: Secondary | ICD-10-CM | POA: Insufficient documentation

## 2015-08-15 DIAGNOSIS — O864 Pyrexia of unknown origin following delivery: Secondary | ICD-10-CM | POA: Insufficient documentation

## 2015-08-15 DIAGNOSIS — O8612 Endometritis following delivery: Secondary | ICD-10-CM | POA: Diagnosis present

## 2015-08-15 DIAGNOSIS — R509 Fever, unspecified: Secondary | ICD-10-CM | POA: Diagnosis present

## 2015-08-15 DIAGNOSIS — Z8249 Family history of ischemic heart disease and other diseases of the circulatory system: Secondary | ICD-10-CM | POA: Diagnosis not present

## 2015-08-15 DIAGNOSIS — Z8041 Family history of malignant neoplasm of ovary: Secondary | ICD-10-CM | POA: Insufficient documentation

## 2015-08-15 DIAGNOSIS — Z9889 Other specified postprocedural states: Secondary | ICD-10-CM | POA: Insufficient documentation

## 2015-08-15 LAB — COMPREHENSIVE METABOLIC PANEL
ALBUMIN: 3.4 g/dL — AB (ref 3.5–5.0)
ALK PHOS: 110 U/L (ref 38–126)
ALT: 47 U/L (ref 14–54)
AST: 41 U/L (ref 15–41)
Anion gap: 10 (ref 5–15)
BILIRUBIN TOTAL: 0.6 mg/dL (ref 0.3–1.2)
BUN: 8 mg/dL (ref 6–20)
CALCIUM: 8.8 mg/dL — AB (ref 8.9–10.3)
CO2: 24 mmol/L (ref 22–32)
Chloride: 106 mmol/L (ref 101–111)
Creatinine, Ser: 0.6 mg/dL (ref 0.44–1.00)
GFR calc Af Amer: >60 mL/min (ref >60)
GFR calc non Af Amer: >60 mL/min (ref >60)
GLUCOSE: 116 mg/dL — AB (ref 65–99)
POTASSIUM: 3.6 mmol/L (ref 3.5–5.1)
Sodium: 140 mmol/L (ref 135–145)
TOTAL PROTEIN: 7.1 g/dL (ref 6.5–8.1)

## 2015-08-15 LAB — URINALYSIS COMPLETE WITH MICROSCOPIC (ARMC ONLY)
BACTERIA UA: NONE SEEN
Bilirubin Urine: NEGATIVE
GLUCOSE, UA: NEGATIVE mg/dL
Ketones, ur: NEGATIVE mg/dL
Nitrite: NEGATIVE
PROTEIN: NEGATIVE mg/dL
Specific Gravity, Urine: 1.004 — ABNORMAL LOW (ref 1.005–1.030)
pH: 7 (ref 5.0–8.0)

## 2015-08-15 LAB — CK: Total CK: 300 U/L — ABNORMAL HIGH (ref 38–234)

## 2015-08-15 LAB — CBC WITH DIFFERENTIAL/PLATELET
BASOS ABS: 0.2 10*3/uL — AB (ref 0–0.1)
Eosinophils Absolute: 0.3 10*3/uL (ref 0–0.7)
Eosinophils Relative: 2 %
HEMATOCRIT: 32.5 % — AB (ref 35.0–47.0)
HEMOGLOBIN: 11 g/dL — AB (ref 12.0–16.0)
Lymphocytes Relative: 6 %
Lymphs Abs: 1 10*3/uL (ref 1.0–3.6)
MCH: 31.4 pg (ref 26.0–34.0)
MCHC: 33.8 g/dL (ref 32.0–36.0)
MCV: 92.8 fL (ref 80.0–100.0)
Monocytes Absolute: 1 10*3/uL — ABNORMAL HIGH (ref 0.2–0.9)
Monocytes Relative: 6 %
NEUTROS ABS: 14.5 10*3/uL — AB (ref 1.4–6.5)
Platelets: 384 10*3/uL (ref 150–440)
RBC: 3.51 MIL/uL — AB (ref 3.80–5.20)
RDW: 14 % (ref 11.5–14.5)
WBC: 17 10*3/uL — AB (ref 3.6–11.0)

## 2015-08-15 LAB — POCT PREGNANCY, URINE: PREG TEST UR: NEGATIVE

## 2015-08-15 LAB — GENTAMICIN LEVEL, RANDOM: GENTAMICIN RM: 1.8 ug/mL

## 2015-08-15 LAB — LACTIC ACID, PLASMA: LACTIC ACID, VENOUS: 0.7 mmol/L (ref 0.5–2.0)

## 2015-08-15 MED ORDER — PANTOPRAZOLE SODIUM 40 MG PO TBEC
40.0000 mg | DELAYED_RELEASE_TABLET | Freq: Every day | ORAL | Status: DC
  Administered 2015-08-15 – 2015-08-16 (×2): 40 mg via ORAL
  Filled 2015-08-15 (×3): qty 1

## 2015-08-15 MED ORDER — SODIUM CHLORIDE 0.9 % IV BOLUS (SEPSIS)
1000.0000 mL | Freq: Once | INTRAVENOUS | Status: AC
  Administered 2015-08-15: 1000 mL via INTRAVENOUS

## 2015-08-15 MED ORDER — TEMAZEPAM 15 MG PO CAPS: 15.0000 mg | ORAL_CAPSULE | Freq: Every evening | ORAL | Status: DC | PRN

## 2015-08-15 MED ORDER — BREAST MILK
ORAL | Status: DC
  Filled 2015-08-15 (×25): qty 1

## 2015-08-15 MED ORDER — BENZOCAINE-MENTHOL 20-0.5 % EX AERO: 1.0000 "application " | INHALATION_SPRAY | Freq: Four times a day (QID) | CUTANEOUS | Status: DC | PRN

## 2015-08-15 MED ORDER — CLINDAMYCIN PHOSPHATE 900 MG/50ML IV SOLN
900.0000 mg | Freq: Three times a day (TID) | INTRAVENOUS | Status: DC
  Administered 2015-08-16 (×3): 900 mg via INTRAVENOUS
  Filled 2015-08-15 (×5): qty 50

## 2015-08-15 MED ORDER — CLINDAMYCIN PHOSPHATE 900 MG/50ML IV SOLN
900.0000 mg | Freq: Three times a day (TID) | INTRAVENOUS | Status: DC
  Filled 2015-08-15 (×2): qty 50

## 2015-08-15 MED ORDER — ONDANSETRON HCL 4 MG/2ML IJ SOLN: 4.0000 mg | Freq: Four times a day (QID) | INTRAMUSCULAR | Status: DC | PRN

## 2015-08-15 MED ORDER — ONDANSETRON HCL 4 MG PO TABS: 4.0000 mg | ORAL_TABLET | Freq: Four times a day (QID) | ORAL | Status: DC | PRN

## 2015-08-15 MED ORDER — IBUPROFEN 600 MG PO TABS
600.0000 mg | ORAL_TABLET | Freq: Once | ORAL | Status: AC
  Administered 2015-08-15: 600 mg via ORAL
  Filled 2015-08-15: qty 1

## 2015-08-15 MED ORDER — HYDROCODONE-ACETAMINOPHEN 5-325 MG PO TABS
1.0000 | ORAL_TABLET | ORAL | Status: DC | PRN
  Administered 2015-08-15 – 2015-08-16 (×3): 1 via ORAL
  Filled 2015-08-15: qty 1
  Filled 2015-08-15: qty 2
  Filled 2015-08-15: qty 1

## 2015-08-15 MED ORDER — PRENATAL MULTIVITAMIN CH
1.0000 | ORAL_TABLET | Freq: Every day | ORAL | Status: DC
  Administered 2015-08-15 – 2015-08-16 (×2): 1 via ORAL
  Filled 2015-08-15 (×2): qty 1

## 2015-08-15 MED ORDER — DOCUSATE SODIUM 100 MG PO CAPS
100.0000 mg | ORAL_CAPSULE | Freq: Two times a day (BID) | ORAL | Status: DC
  Administered 2015-08-15 – 2015-08-16 (×3): 100 mg via ORAL
  Filled 2015-08-15 (×3): qty 1

## 2015-08-15 MED ORDER — CLINDAMYCIN PHOSPHATE 900 MG/50ML IV SOLN
900.0000 mg | Freq: Once | INTRAVENOUS | Status: AC
  Administered 2015-08-15: 900 mg via INTRAVENOUS
  Filled 2015-08-15: qty 50

## 2015-08-15 MED ORDER — LACTATED RINGERS IV SOLN
INTRAVENOUS | Status: DC
  Administered 2015-08-15 – 2015-08-16 (×3): via INTRAVENOUS

## 2015-08-15 MED ORDER — DEXTROSE 5 % IV SOLN
7.0000 mg/kg | INTRAVENOUS | Status: DC
  Administered 2015-08-15 – 2015-08-16 (×2): 570 mg via INTRAVENOUS
  Filled 2015-08-15 (×3): qty 14.25

## 2015-08-15 MED ORDER — MAGNESIUM CITRATE PO SOLN
1.0000 | Freq: Once | ORAL | Status: DC | PRN
  Filled 2015-08-15: qty 296

## 2015-08-15 MED ORDER — CLINDAMYCIN PHOSPHATE 900 MG/50ML IV SOLN
900.0000 mg | Freq: Three times a day (TID) | INTRAVENOUS | Status: AC
  Administered 2015-08-15: 900 mg via INTRAVENOUS
  Filled 2015-08-15: qty 50

## 2015-08-15 MED ORDER — DEXTROSE 5 % IV SOLN
1.5000 mg/kg | Freq: Once | INTRAVENOUS | Status: DC
  Filled 2015-08-15: qty 4.5

## 2015-08-15 MED ORDER — IBUPROFEN 600 MG PO TABS
600.0000 mg | ORAL_TABLET | Freq: Four times a day (QID) | ORAL | Status: DC | PRN
  Administered 2015-08-15 – 2015-08-16 (×4): 600 mg via ORAL
  Filled 2015-08-15 (×4): qty 1

## 2015-08-15 NOTE — ED Notes (Signed)
Pt also reports she has not started lactating. Pt denies rash, redness to nipples.

## 2015-08-15 NOTE — ED Notes (Signed)
Pt. States having a baby on the 13 th of May.  Pt. States body aches from the date of having baby.  Pt. States fever at 3 am today.  Pt. States she took 1 gram of tylenol.

## 2015-08-15 NOTE — Lactation Note (Signed)
Lactation Consultation Note  Patient Name: Dana Hatfield [id number] Date: 08/15/2015  Mom requested lactation consult.  As soon as LC entered room and introduced herself, mom became tearful.  She commented how much she wanted to still breast feed her baby or at least to pump and give her expressed breast milk.  Mom had breast fed for the first 3 days and the baby lost from [place] at birth to [place].  Mom started pumping on day 4 and was not getting anything when pumping using her Medela DEBP that she got through her insurance.  Both mom's nipples had positional stripes and were slightly uncomfortable.  Mom's breasts were soft and she reported not having any noticeable changes in her breasts since becoming pregnant.  Discussed risks of infertility and PCOS to full milk supply, but sometimes could still breast feed successfully.  Mom still committed to pumping for now and trying baby at breast hopefully later.  Set up Symphony DEBP and instructed in use and collection, storage and handling of her breast milk.  Moist warmth applied to breasts and hand massaged.  Could easily hand express small amount of transitional breast milk.  Coconut oil applied around flange.  Assisted mom with pumping bilaterally in preemie mode.  Mom pumped 3 ml and lost a little when removing flanges from breast.  All of milk obtained was from left breast.  Discussed methods to increase milk supply.  Hand expressed milk and applied to nipples and comfort gels applied.      Maternal Data    Feeding    [facility] Score/Interventions                      Lactation Tools Discussed/Used     Consult Status      [name] 08/15/2015, 4:54 PM

## 2015-08-15 NOTE — Progress Notes (Signed)
Pharmacy Antibiotic Note  Dana Hatfield is a 30 y.o. female admitted on 08/15/2015 with postpartum endometritis.Marland Kitchen  Pharmacy has been consulted for gentamicin dosing.  Plan: Will start patient on gentamicin 7mg /kg extended interval dosing based on patients adjusted body weight of 81kg.   Gentamicin 570mg  IV every 24 hours ordered to start @ 1130. Gentamicin random level ordered for 2200 tonight.  Pharmacy will dose based on level and [place] Nomogram.  6/4 PM gentamicin level 1.8. OK per nomogram. Continue current regimen.  Height: 5\' 5"  (165.1 cm) Weight: 260 lb (117.935 kg) IBW/kg (Calculated) : 57  Temp (24hrs), Avg:99.6 F (37.6 C), Min:98.1 F (36.7 C), Max:101.6 F (38.7 C)   Recent Labs Lab 08/09/15 2325 08/10/15 0819 08/11/15 0555 08/15/15 0750 08/15/15 0921 08/15/15 2228  WBC 11.3* 11.7* 20.1* 17.0*  --   --   CREATININE  --  0.55  --  0.60  --   --   LATICACIDVEN  --   --   --   --  0.7  --   GENTRANDOM  --   --   --   --   --  1.8    Estimated Creatinine Clearance: 133.3 mL/min (by C-G formula based on Cr of 0.6).    Allergies  Allergen Reactions  . Latex Itching    Antimicrobials this admission: 6/4 clindamycin  >>  6/4 gentamicin >>   Microbiology results: 6/4 BCx: pending  6/4 UCx: pending   Thank you for allowing pharmacy to be a part of this patient's care.  Nancy Fetter, PharmD Pharmacy Resident  08/15/2015 11:22 PM

## 2015-08-15 NOTE — H&P (Signed)
GYNECOLOGY INPATIENT HISTORY AND PHYSICAL  HPI:  Dana Hatfield is an 30 y.o. G26P1001 female who is 5 days postpartum s/p uncomplicated vaginal delivery (at [facility], currently sees [facility] in [place]) who presented to the Emergency Room with complaints of fevers, chills, and general malaise x 1 day.  Patient reports that she took her temperature this morning and it was 100.5. Notes that she was instructed by a triage nurse at [facility] to go to the nearest hospital for evaluation.  She has had myalgias but she denies any abdominal pain, no foul-smelling lochia, no pain or burning with urination, no leg pain. Aldo denies URI symptoms. She is not breast-feeding currently (notes that her milk has not come in, however is working with a Science writer and pumping), she has not noted any rash on the breasts. Baby is doing well.  Patient was also noted to be tachycardic in the Emergency Room.  Was treated with IVF bolus x 2, Motrin.   On review of patient's recent hospital course with delivery, was noted that patient had PROM for ~ 24 hrs, with light meconium; antepartum course was uncomplicated.  Patient was GBS negative.   Pertinent Gynecological History: Sexually transmitted diseases: no past history Previous GYN Procedures: None    Menstrual History: Menarche age: 35  Patient's last menstrual period was 10/07/2014.     OB History  Gravida Para Term Preterm AB SAB TAB Ectopic Multiple Living  1 1 1  0 0 0 0 0 0 1    # Outcome Date GA Lbr Len/2nd Weight Sex Delivery Anes PTL Lv  1 Term 08/10/15 [redacted]w[redacted]d / 03:34 9 lb 1.3 oz (4.12 kg) F Vag-Spont EPI,Local  Y     Comments: none     Past Medical History  Diagnosis Date  . Allergy   . Vaginal Pap smear, abnormal   . Infertility, female   . Ovarian cyst   . Melanoma ([place]) 2016    atypical nevis removed    Past Surgical History  Procedure Laterality Date  . No past surgeries      Family History  Problem Relation  Age of Onset  . Parkinson's disease Mother   . Arthritis/Rheumatoid Mother   . Rheum arthritis Mother   . Melanoma Mother   . Ovarian cancer Maternal Grandmother   . Cancer Paternal Grandmother     liver  . Alcohol abuse Neg Hx   . Arthritis Neg Hx   . Asthma Neg Hx   . Birth defects Neg Hx   . COPD Neg Hx   . Depression Neg Hx   . Diabetes Neg Hx   . Drug abuse Neg Hx   . Early death Neg Hx   . Hearing loss Neg Hx   . Hyperlipidemia Neg Hx   . Hypertension Neg Hx   . Kidney disease Neg Hx   . Learning disabilities Neg Hx   . Mental illness Neg Hx   . Mental retardation Neg Hx   . Miscarriages / Stillbirths Neg Hx   . Stroke Neg Hx   . Vision loss Neg Hx   . Varicose Veins Neg Hx   . Melanoma Sister   . Heart disease Paternal Grandfather   . Melanoma Paternal Grandfather     Social History   Social History  . Marital Status: Married    Spouse Name: n/a  . Number of Children: 0  . Years of Education: 16   Occupational History  . 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 Smoking status: Never Smoker   . Smokeless tobacco: Never Used  . Alcohol Use: No  . Drug Use: No  . Sexual Activity: Yes    Birth Control/ Protection: Pill   Other Topics Concern  . Not on file   Social History Narrative   ** Merged History Encounter **         Allergies  Allergen Reactions  . Latex Itching    Prescriptions prior to admission  Medication Sig Dispense Refill Last Dose  . acetaminophen (TYLENOL) 500 MG tablet Take 1,000 mg by mouth every 6 (six) hours as needed for mild pain or moderate pain.   08/15/2015 at Unknown time  . docusate sodium (COLACE) 100 MG capsule Take 100 mg by mouth 2 (two) times daily.   08/14/2015 at Unknown time  . ibuprofen (ADVIL,MOTRIN) 600 MG tablet Take 1 tablet (600 mg total) by mouth every 6 (six) hours. 30 tablet 0 08/15/2015 at Unknown time  . Prenatal Vit-Fe Fumarate-FA (PRENATAL MULTIVITAMIN) TABS tablet Take 1  tablet by mouth daily at 12 noon.    08/14/2015 at Unknown time  . esomeprazole (NEXIUM) 40 MG capsule Take 1 capsule (40 mg total) by mouth daily at 12 noon. 30 capsule 1 08/13/2015    ROS Constitutional: + fever/chills. Eyes: No visual changes. ENT: No sore throat. Cardiovascular: Denies chest pain. Respiratory: Denies shortness of breath. Gastrointestinal: No abdominal pain. No nausea, no vomiting. No diarrhea. No constipation. Genitourinary: Negative for dysuria. Musculoskeletal: Negative for back pain. Skin: Negative for rash. Neurological: Negative for headaches, focal weakness or numbness.   Blood pressure 142/92, pulse 120, temperature 98.8 F (37.1 C), temperature source Oral, resp. rate 18, height 5\' 5"  (1.651 m), weight 260 lb (117.935 kg), last menstrual period 10/07/2014, SpO2 98 %, not currently breastfeeding. Physical Exam Constitutional: Alert and oriented. Tearful bu easily consoled saying "I just want to get home to my baby". Eyes: Conjunctivae are normal. PERRL. EOMI. Head: Atraumatic. Nose: No congestion/rhinnorhea. Mouth/Throat: Mucous membranes are moist. Oropharynx non-erythematous. Neck: No stridor. Supple without meningismus. Cardiovascular: Tachycardic rate, regular rhythm. Grossly normal heart sounds. Good peripheral circulation. Respiratory: Normal respiratory effort. No retractions. Lungs CTAB. Gastrointestinal: Soft and nontender. No distention. No CVA tenderness. Genitourinary: moderate dark red blood (lochia) in vault. Vagina without lesions.  Cervix appears normal, no lesions. Uterine fundus firm, ~ 3 cm below umbilicus.  Musculoskeletal: No lower extremity tenderness nor edema. No joint effusions. Normal breasts bilaterally, no tenderness, no erythema or warmth. Neurologic: Normal speech and language. No gross focal neurologic deficits are appreciated. No gait instability. Skin: Skin is warm, dry and intact. No rash noted. Psychiatric: Mood  and affect are normal. Speech and behavior are normal.    Results for orders placed or performed during the hospital encounter of 08/15/15 (from the past 24 hour(s))  CBC with Differential     Status: Abnormal   Collection Time: 08/15/15  7:50 AM  Result Value Ref Range   WBC 17.0 (H) 3.6 - 11.0 K/uL   RBC 3.51 (L) 3.80 - 5.20 MIL/uL   Hemoglobin 11.0 (L) 12.0 - 16.0 g/dL   HCT 32.5 (L) 35.0 - 47.0 %   MCV 92.8 80.0 - 100.0 fL   MCH 31.4 26.0 - 34.0 pg   MCHC 33.8 32.0 - 36.0 g/dL   RDW 14.0 11.5 - 14.5 %   Platelets 384 150 - 440 K/uL   Neutrophils Relative % 85% %  Neutro Abs 14.5 (H) 1.4 - 6.5 K/uL   Lymphocytes Relative 6% %   Lymphs Abs 1.0 1.0 - 3.6 K/uL   Monocytes Relative 6% %   Monocytes Absolute 1.0 (H) 0.2 - 0.9 K/uL   Eosinophils Relative 2% %   Eosinophils Absolute 0.3 0 - 0.7 K/uL   Basophils Relative 1% %   Basophils Absolute 0.2 (H) 0 - 0.1 K/uL  Comprehensive metabolic panel     Status: Abnormal   Collection Time: 08/15/15  7:50 AM  Result Value Ref Range   Sodium 140 135 - 145 mmol/L   Potassium 3.6 3.5 - 5.1 mmol/L   Chloride 106 101 - 111 mmol/L   CO2 24 22 - 32 mmol/L   Glucose, Bld 116 (H) 65 - 99 mg/dL   BUN 8 6 - 20 mg/dL   Creatinine, Ser 0.60 0.44 - 1.00 mg/dL   Calcium 8.8 (L) 8.9 - 10.3 mg/dL   Total Protein 7.1 6.5 - 8.1 g/dL   Albumin 3.4 (L) 3.5 - 5.0 g/dL   AST 41 15 - 41 U/L   ALT 47 14 - 54 U/L   Alkaline Phosphatase 110 38 - 126 U/L   Total Bilirubin 0.6 0.3 - 1.2 mg/dL   GFR calc non Af Amer >60 >60 mL/min   GFR calc Af Amer >60 >60 mL/min   Anion gap 10 5 - 15  CK     Status: Abnormal   Collection Time: 08/15/15  7:50 AM  Result Value Ref Range   Total CK 300 (H) 38 - 234 U/L  Urinalysis complete, with microscopic ([facility] only)     Status: Abnormal   Collection Time: 08/15/15  7:50 AM  Result Value Ref Range   Color, Urine YELLOW (A) YELLOW   APPearance HAZY (A) CLEAR   Glucose, UA NEGATIVE NEGATIVE mg/dL   Bilirubin Urine  NEGATIVE NEGATIVE   Ketones, ur NEGATIVE NEGATIVE mg/dL   Specific Gravity, Urine 1.004 (L) 1.005 - 1.030   Hgb urine dipstick 3+ (A) NEGATIVE   pH 7.0 5.0 - 8.0   Protein, ur NEGATIVE NEGATIVE mg/dL   Nitrite NEGATIVE NEGATIVE   Leukocytes, UA 3+ (A) NEGATIVE   RBC / HPF 0-5 0 - 5 RBC/hpf   WBC, UA TOO NUMEROUS TO COUNT 0 - 5 WBC/hpf   Bacteria, UA NONE SEEN NONE SEEN   Squamous Epithelial / LPF 0-5 (A) NONE SEEN   WBC Clumps PRESENT   Pregnancy, urine POC     Status: None   Collection Time: 08/15/15  7:52 AM  Result Value Ref Range   Preg Test, Ur NEGATIVE NEGATIVE  Blood culture (routine x 2)     Status: None (Preliminary result)   Collection Time: 08/15/15  9:21 AM  Result Value Ref Range   Specimen Description BLOOD LEFT ANTECUBITAL    Special Requests BOTTLES DRAWN AEROBIC AND ANAEROBIC  2CC    Culture NO GROWTH < 12 HOURS    Report Status PENDING   Blood culture (routine x 2)     Status: None (Preliminary result)   Collection Time: 08/15/15  9:21 AM  Result Value Ref Range   Specimen Description BLOOD LEFT ARM    Special Requests BOTTLES DRAWN AEROBIC AND ANAEROBIC  2CC    Culture NO GROWTH < 12 HOURS    Report Status PENDING   Lactic acid, plasma     Status: None   Collection Time: 08/15/15  9:21 AM  Result Value Ref Range  Lactic Acid, Venous 0.7 0.5 - 2.0 mmol/L    No results found.  Assessment/Plan: 1. Febrile morbidity of unknown cause in postpartum period - differential diagnosis includes mastitis, UTI, endometritis, DVT, pulmonary infection.  Patient with very vague symptoms, no evidence of rash on breasts or tenderness, no urinary symptoms (although some bacteria noted in UA), denies severe abdominal/pelvic pain, denies leg pain or swelling, denies respiratory symptoms.  Does have an elevated WBC count (however decreased from prior WBC count of 20.1 from postpartum values at recent hospital discharge).  Will still treat for suspected endometritis based on risk  factors noted in HPI (PROM ~ 24 hrs, meconium).  Started on Gentamycin and Clindamycin.  Will continue for 24 hrs until afebrile.  2. Regular diet as tolerated 3. Ambulate as tolerated.  Low risk for VTE, no need for VTE prophylaxis at this time.  4. Motrin for pain/fever as needed 5. Continue with lactation consultation while inpatient.  6. Will repeat CBC in a.m.  Continue to monitor for fevers, worsening symptoms.  7. Continue IVF at 125 ml/hr 8. Lactic acid normal, pending blood and urine cultres (per sepsis protocol)   Rubie Maid, MD [facility] 08/15/2015, 2:23 PM

## 2015-08-15 NOTE — Progress Notes (Signed)
Pharmacy Antibiotic Note  Dana Hatfield is a 30 y.o. female admitted on 08/15/2015 with postpartum endometritis.Marland Kitchen  Pharmacy has been consulted for gentamicin dosing.  Plan: Will start patient on gentamicin 7mg /kg extended interval dosing based on patients adjusted body weight of 81kg.   Gentamicin 570mg  IV every 24 hours ordered to start @ 1130. Gentamicin random level ordered for 2200 tonight.  Pharmacy will dose based on level and [place] Nomogram.  Height: 5\' 5"  (165.1 cm) Weight: 260 lb (117.935 kg) IBW/kg (Calculated) : 57  Temp (24hrs), Avg:99.6 F (37.6 C), Min:98.1 F (36.7 C), Max:101.6 F (38.7 C)   Recent Labs Lab 08/09/15 2325 08/10/15 0819 08/11/15 0555 08/15/15 0750 08/15/15 0921  WBC 11.3* 11.7* 20.1* 17.0*  --   CREATININE  --  0.55  --  0.60  --   LATICACIDVEN  --   --   --   --  0.7    Estimated Creatinine Clearance: 133.3 mL/min (by C-G formula based on Cr of 0.6).    Allergies  Allergen Reactions  . Latex Itching    Antimicrobials this admission: 6/4 clindamycin  >>  6/4 gentamicin >>   Microbiology results: 6/4 BCx: pending  6/4 UCx: pending   Thank you for allowing pharmacy to be a part of this patient's care.  Nancy Fetter, PharmD Pharmacy Resident  08/15/2015 11:32 AM

## 2015-08-15 NOTE — ED Provider Notes (Signed)
[facility] Emergency Department Provider Note   ____________________________________________  Time seen: Approximately 7:08 AM  I have reviewed the triage vital signs and the nursing notes.   HISTORY  Chief Complaint Fever and Generalized Body Aches    HPI Dana Hatfield is a 30 y.o. female with no chronic medical problems, status post term uncomplicated vaginal delivery on 08/10/2015 presents for evaluation of fever today, sudden onset, improved with Tylenol, moderate, no modifying factors. Patient reports she took her temperature this morning and it was 100.5. She has had myalgias but she denies any abdominal pain, no foul-smelling lochia, no pain or burning with urination. No cough, sneezing, runny nose, no rash. She is not breast-feeding, she has not noted any rash on the breasts. Baby is doing well.   Past Medical History  Diagnosis Date  . Allergy   . Vaginal Pap smear, abnormal   . Infertility, female   . Ovarian cyst   . Melanoma ([place]) 2016    atypical nevis removed    Patient Active Problem List   Diagnosis Date Noted  . Pregnancy 08/10/2015  . Indication for care in labor or delivery 08/09/2015    Past Surgical History  Procedure Laterality Date  . No past surgeries      Current Outpatient Rx  Name  Route  Sig  Dispense  Refill  . acetaminophen (TYLENOL) 500 MG tablet   Oral   Take 1,000 mg by mouth every 6 (six) hours as needed for mild pain or moderate pain.         Marland Kitchen docusate sodium (COLACE) 100 MG capsule   Oral   Take 100 mg by mouth 2 (two) times daily.         Marland Kitchen ibuprofen (ADVIL,MOTRIN) 600 MG tablet   Oral   Take 1 tablet (600 mg total) by mouth every 6 (six) hours.   30 tablet   0   . Prenatal Vit-Fe Fumarate-FA (PRENATAL MULTIVITAMIN) TABS tablet   Oral   Take 1 tablet by mouth daily at 12 noon.          Marland Kitchen esomeprazole (NEXIUM) 40 MG capsule   Oral   Take 1 capsule (40 mg total) by mouth daily at 12  noon.   30 capsule   1     Allergies Latex  Family History  Problem Relation Age of Onset  . Parkinson's disease Mother   . Arthritis/Rheumatoid Mother   . Rheum arthritis Mother   . Melanoma Mother   . Ovarian cancer Maternal Grandmother   . Cancer Paternal Grandmother     liver  . Alcohol abuse Neg Hx   . Arthritis Neg Hx   . Asthma Neg Hx   . Birth defects Neg Hx   . COPD Neg Hx   . Depression Neg Hx   . Diabetes Neg Hx   . Drug abuse Neg Hx   . Early death Neg Hx   . Hearing loss Neg Hx   . Hyperlipidemia Neg Hx   . Hypertension Neg Hx   . Kidney disease Neg Hx   . Learning disabilities Neg Hx   . Mental illness Neg Hx   . Mental retardation Neg Hx   . Miscarriages / Stillbirths Neg Hx   . Stroke Neg Hx   . Vision loss Neg Hx   . Varicose Veins Neg Hx   . Melanoma Sister   . Heart disease Paternal Grandfather   . Melanoma Paternal Grandfather  Social History Social History  Substance Use Topics  . Smoking status: Never Smoker   . Smokeless tobacco: Never Used  . Alcohol Use: No    Review of Systems Constitutional: + fever/chills. Eyes: No visual changes. ENT: No sore throat. Cardiovascular: Denies chest pain. Respiratory: Denies shortness of breath. Gastrointestinal: No abdominal pain.  No nausea, no vomiting.  No diarrhea.  No constipation. Genitourinary: Negative for dysuria. Musculoskeletal: Negative for back pain. Skin: Negative for rash. Neurological: Negative for headaches, focal weakness or numbness.  10-point ROS otherwise negative.    ____________________________________________   PHYSICAL EXAM:  VITAL SIGNS: ED Triage Vitals  Enc Vitals Group     BP 08/15/15 0627 143/78 mmHg     Pulse Rate 08/15/15 0705 107     Resp 08/15/15 0627 18     Temp 08/15/15 0627 99.7 F (37.6 C)     Temp Source 08/15/15 0627 Oral     SpO2 08/15/15 0627 99 %     Weight 08/15/15 0627 260 lb (117.935 kg)     Height 08/15/15 0627 5\' 5"  (1.651 m)      Head Cir --      Peak Flow --      Pain Score 08/15/15 0628 4     Pain Loc --      Pain Edu? --      Excl. in [place]? --     Constitutional: Alert and oriented. Tearful bu easily consoled saying "I just want to get home to my baby". Eyes: Conjunctivae are normal. PERRL. EOMI. Head: Atraumatic. Nose: No congestion/rhinnorhea. Mouth/Throat: Mucous membranes are moist.  Oropharynx non-erythematous. Neck: No stridor.  Supple without meningismus. Cardiovascular: Tachycardic rate, regular rhythm. Grossly normal heart sounds.  Good peripheral circulation. Respiratory: Normal respiratory effort.  No retractions. Lungs CTAB. Gastrointestinal: Soft and nontender. No distention.  No CVA tenderness. Genitourinary: deferred Musculoskeletal: No lower extremity tenderness nor edema.  No joint effusions. Normal breasts bilaterally, no tenderness, no erythema or warmth. Neurologic:  Normal speech and language. No gross focal neurologic deficits are appreciated. No gait instability. Skin:  Skin is warm, dry and intact. No rash noted. Psychiatric: Mood and affect are normal. Speech and behavior are normal.  ____________________________________________   LABS (all labs ordered are listed, but only abnormal results are displayed)  Labs Reviewed  CBC WITH DIFFERENTIAL/PLATELET - Abnormal; Notable for the following:    WBC 17.0 (*)    RBC 3.51 (*)    Hemoglobin 11.0 (*)    HCT 32.5 (*)    Neutro Abs 14.5 (*)    Monocytes Absolute 1.0 (*)    Basophils Absolute 0.2 (*)    All other components within normal limits  COMPREHENSIVE METABOLIC PANEL - Abnormal; Notable for the following:    Glucose, Bld 116 (*)    Calcium 8.8 (*)    Albumin 3.4 (*)    All other components within normal limits  CK - Abnormal; Notable for the following:    Total CK 300 (*)    All other components within normal limits  URINALYSIS COMPLETEWITH MICROSCOPIC ([facility] ONLY) - Abnormal; Notable for the following:    Color,  Urine YELLOW (*)    APPearance HAZY (*)    Specific Gravity, Urine 1.004 (*)    Hgb urine dipstick 3+ (*)    Leukocytes, UA 3+ (*)    Squamous Epithelial / LPF 0-5 (*)    All other components within normal limits  URINE CULTURE  CULTURE, BLOOD (ROUTINE X 2)  CULTURE, BLOOD (ROUTINE X 2)  LACTIC ACID, PLASMA  LACTIC ACID, PLASMA  POCT PREGNANCY, URINE   ____________________________________________  EKG  none ____________________________________________  RADIOLOGY  none ____________________________________________   PROCEDURES  Procedure(s) performed: None  Critical Care performed: Yes. Total critical care time spent 35 minutes.  ____________________________________________   INITIAL IMPRESSION / ASSESSMENT AND PLAN / ED COURSE  Pertinent labs & imaging results that were available during my care of the patient were reviewed by me and considered in my medical decision making (see chart for details).  Dana Hatfield is a 30 y.o. female with no chronic medical problems, status post term uncomplicated vaginal delivery on 08/10/2015 presents for evaluation of fever today to 100.5, sudden onset, improved with Tylenol. On exam, she is nontoxic appearing, tearful, mildly tachycardic, afebrile, maintaining adequate blood pressure. The remainder of her examination is benign. We'll obtain screening labs, give IV fluids, discuss with OB/GYN.  ----------------------------------------- 9:20 AM on 08/15/2015 ----------------------------------------- The patient is nwt febrile, temperature 101.6, her white count is elevated at 17,000 however this is down trending from prior. She remains mildly tachycardic and is meeting 3 out of 4 criteria for sepsis. Her workup is otherwise generally unremarkable, urinalysis with multiple white blood cells however no bacteria concerning for UTI versus reactive white blood cells in the setting of endometritis. I discussed the case with Dr. Marcelline Mates of OB/GYN  who will admit the patient, she recommends giving IV gentamicin and clindamycin which I have ordered. Patient is receiving 2 L of normal saline, maintaining adequate blood pressure, no evidence to suggest severe sepsis/septic shock so will not give full 30 ml/kg bolus of fluids at this time. ____________________________________________   FINAL CLINICAL IMPRESSION(S) / ED DIAGNOSES  Final diagnoses:  Fever, unspecified fever cause  Endometritis  Sepsis, due to unspecified organism [facility])      NEW MEDICATIONS STARTED DURING THIS VISIT:  New Prescriptions   No medications on file     Note:  This document was prepared using Dragon voice recognition software and may include unintentional dictation errors.    Joanne Gavel, MD 08/15/15 [phone]

## 2015-08-15 NOTE — ED Notes (Signed)
Pt reports she gave birth on 5/30. Pt c/o of body aches since labor and new onset fever.  Pt reports she began to have general malaise last night, and took her temperature and it was 98. Pt woke to feed baby, had chills took temp again between 3-4 am and had temp of 101.5. Pt took 1 g tylenol at 4 am.

## 2015-08-16 LAB — CBC WITH DIFFERENTIAL/PLATELET
Basophils Absolute: 0.1 10*3/uL (ref 0–0.1)
Basophils Relative: 1 %
EOS ABS: 0.2 10*3/uL (ref 0–0.7)
HCT: 27.4 % — ABNORMAL LOW (ref 35.0–47.0)
Hemoglobin: 9.3 g/dL — ABNORMAL LOW (ref 12.0–16.0)
LYMPHS ABS: 1 10*3/uL (ref 1.0–3.6)
MCH: 31.1 pg (ref 26.0–34.0)
MCHC: 34.1 g/dL (ref 32.0–36.0)
MCV: 91.2 fL (ref 80.0–100.0)
MONO ABS: 0.6 10*3/uL (ref 0.2–0.9)
Neutro Abs: 6.5 10*3/uL (ref 1.4–6.5)
Neutrophils Relative %: 78 %
PLATELETS: 320 10*3/uL (ref 150–440)
RBC: 3 MIL/uL — ABNORMAL LOW (ref 3.80–5.20)
RDW: 13.5 % (ref 11.5–14.5)
WBC: 8.3 10*3/uL (ref 3.6–11.0)

## 2015-08-16 LAB — URINE CULTURE
CULTURE: NO GROWTH
SPECIAL REQUESTS: NORMAL

## 2015-08-16 MED ORDER — FERROUS SULFATE 325 (65 FE) MG PO TABS
325.0000 mg | ORAL_TABLET | Freq: Three times a day (TID) | ORAL | Status: DC
  Administered 2015-08-16 (×2): 325 mg via ORAL
  Filled 2015-08-16 (×2): qty 1

## 2015-08-16 MED ORDER — SODIUM CHLORIDE 0.9% FLUSH
3.0000 mL | Freq: Three times a day (TID) | INTRAVENOUS | Status: DC
  Administered 2015-08-16: 3 mL via INTRAVENOUS

## 2015-08-16 MED ORDER — BENZOCAINE-MENTHOL 20-0.5 % EX AERO: 1.0000 "application " | INHALATION_SPRAY | Freq: Four times a day (QID) | CUTANEOUS | Status: DC | PRN

## 2015-08-16 MED ORDER — COMPLETENATE 29-1 MG PO CHEW: 1.0000 | CHEWABLE_TABLET | Freq: Every day | ORAL | Status: DC

## 2015-08-16 MED ORDER — FERROUS SULFATE 325 (65 FE) MG PO TABS: 325.0000 mg | ORAL_TABLET | Freq: Every day | ORAL | Status: DC

## 2015-08-16 MED ORDER — FERROUS SULFATE 325 (65 FE) MG PO TABS: 325.0000 mg | ORAL_TABLET | Freq: Three times a day (TID) | ORAL | Status: DC

## 2015-08-16 NOTE — Discharge Instructions (Signed)
Endometritis °Endometritis is an irritation, soreness, and swelling (inflammation) of the lining of the uterus (endometrium).  °CAUSES  °· Bacterial infections. °· Sexually transmitted infections (STIs). °· Having a miscarriage or childbirth, especially after a long labor or cesarean delivery. °· Certain gynecological procedures (such as dilation and curettage, hysteroscopy, or contraceptive insertion). °SIGNS AND SYMPTOMS  °· Fever. °· Lower abdominal or pelvic pain. °· Abnormal vaginal discharge or bleeding. °· Abdominal bloating (distention) or swelling. °· General discomfort or ill feeling. °· Discomfort with bowel movements. °DIAGNOSIS  °A physical and pelvic exam are performed. Other tests may include: °· Cultures from the cervix. °· Blood tests. °· Examining a tissue sample of the uterine lining (endometrial biopsy). °· Examining discharge under a microscope (wet prep). °· Laparoscopy. °TREATMENT  °Antibiotic medicines are usually given. Other treatments may include: °· Fluids through an IV tube inserted in your vein. °· Rest. °HOME CARE INSTRUCTIONS  °· Take over-the-counter or prescription medicines for pain, discomfort, or fever as directed by your health care provider. °· Take your antibiotics as directed. Finish them even if you start to feel better. °· Resume your normal diet and activities as directed or as tolerated. °· Do not douche or have sexual intercourse until your health care provider says it is okay. °· Do not have sexual intercourse until your partner has been treated if your endometritis is caused by an STI. °SEEK IMMEDIATE MEDICAL CARE IF:  °· You have swelling or increasing pain in the abdomen. °· You have a fever. °· You have bad smelling vaginal discharge, or you have an increased amount of discharge. °· You have abnormal vaginal bleeding. °· Your medicine is not helping with the pain. °· You experience any problems that may be related to the medicine you are taking. °· You have nausea  and vomiting, or you cannot keep foods down. °· You have pain with bowel movements. °MAKE SURE YOU:  °· Understand these instructions. °· Will watch your condition. °· Will get help right away if you are not doing well or get worse. °  °This information is not intended to replace advice given to you by your health care provider. Make sure you discuss any questions you have with your health care provider. °  °Document Released: 02/21/2001 Document Revised: 10/30/2012 Document Reviewed: 09/26/2012 °Elsevier Interactive Patient Education ©2016 [employer] ° °

## 2015-08-16 NOTE — Progress Notes (Signed)
Discharge instructions reviewed with patient and prescription given, patient verbalized understanding. Spoke with Dr. Marcelline Mates on telephone at 2130, Dr. Marcelline Mates having trouble logging onto Epic at home, gave verbal discharge order and stated pt was okay to go home at this time.

## 2015-08-16 NOTE — Progress Notes (Addendum)
HD#2  Subjective: Patient reports last fever at 9:00 pm last night.  Notes chills have improved.  Is noting chest soreness and leg soreness, thinks it is musculoskeletal due to muscles used for vaginal delivery. Also notes chest tightness may be related to mild anxiety as she wants to go home to be with her newborn. Denies SOB, cough, leg pain or swelling.   Objective: I have reviewed patient's vital signs, intake and output, medications and labs.  Temp:  [98.1 F (36.7 C)-101.2 F (38.4 C)] 98.9 F (37.2 C) (06/05 0303) Pulse Rate:  [93-122] 93 (06/05 0303) Resp:  [17-20] 18 (06/05 0303) BP: (127-148)/(77-92) 144/85 mmHg (06/05 0303) SpO2:  [95 %-100 %] 100 % (06/05 0303)   General: alert and no distress Resp: clear to auscultation bilaterally Breasts: non-tender, non-engorged, no rashes Cardio: regular rate and rhythm, S1, S2 normal, no murmur, click, rub or gallop GI: soft, non-tender; bowel sounds normal; no masses,  no organomegaly Vaginal Bleeding: minimal Extremities: extremities normal, atraumatic, no cyanosis or edema   Labs:  CBC Latest Ref Rng 08/16/2015 08/15/2015 08/11/2015  WBC 3.6 - 11.0 K/uL 8.3 17.0(H) 20.1(H)  Hemoglobin 12.0 - 16.0 g/dL 9.3(L) 11.0(L) 9.3(L)  Hematocrit 35.0 - 47.0 % 27.4(L) 32.5(L) 26.8(L)  Platelets 150 - 440 K/uL 320 384 256     Microbiology Results (Last 24 hours)    Date/Time Culture      08/15/15 0921 NO GROWTH < 12 HOURS   Details    08/15/15 0921 NO GROWTH < 12 HOURS       Urine Culture pending, but no growth < 12 hours  Results for Dana Hatfield, Dana Hatfield (MRN [id number]) as of 08/16/2015 08:55  Ref. Range 08/15/2015 09:21  Lactic Acid, Venous Latest Ref Range: 0.5-2.0 mmol/L 0.7   Assessment/Plan: 1. Febrile morbidity postpartum, treating for suspected postpartum endometritis. Continue Gentamycin and Clindamycin x 24 hrs post last fever.  Last temp at 2100, was 101.5.  No evidence of sepsis based on labs and vitals. Clinical  presentation is improving.  2. Leukocytosis has resolved.  3. Mild anemia, likely secondary to routine postpartum blood loss.  Will treat with iron.  4. Continue breast pumping while inpatient.  Continue to work with Science writer.  5. Anxiety, can take restoril.  6. Patient desires to d/c home if no more fevers today, even if late hour.  Can arrange as long as patient is stable.  7. Will place TED hose for DVT prophylaxis as patient now will be here longer than 24 hrs.     Rubie Maid 08/16/2015, 8:49 AM

## 2015-08-20 LAB — CULTURE, BLOOD (ROUTINE X 2)
CULTURE: NO GROWTH
Culture: NO GROWTH

## 2015-08-23 NOTE — Discharge Summary (Signed)
Gynecology Discharge Summary     Patient Name: Dana Hatfield DOB: 1986-01-08 MRN: [id number]  Date of admission: 08/15/2015 Delivering MD: Linda Hedges   Date of discharge: 08/16/2015  Admitting diagnosis: Endometritis [N71.9] Fever, unspecified fever cause [R50.9]  Secondary diagnosis:  Active Problems:   Postpartum endometritis  Additional problems: None     Discharge diagnosis: Postpartum endometritis                      Complications: None  Hospital course:  The patient was admitted to the Gynecology service where she was treated with IV antibiotics for 36 hours.  She remained afebrile for 24 hours. She was discharged home on HD#2.   Physical exam  Filed Vitals:   08/16/15 1200 08/16/15 1601 08/16/15 1945 08/16/15 2059  BP: 128/78 127/84 139/85   Pulse: 85 85 87   Temp: 97.4 F (36.3 C) 98 F (36.7 C) 98.3 F (36.8 C) 97.8 F (36.6 C)  TempSrc: Oral Oral Oral Oral  Resp: 18 18 20    Height:      Weight:      SpO2: 99% 98% 100%    General: alert and no distress Lochia: appropriate Uterine Fundus: firm Incision: None DVT Evaluation: No evidence of DVT seen on physical exam. Negative Homan's sign. No cords or calf tenderness. No significant calf/ankle edema. Labs: Lab Results  Component Value Date   WBC 8.3 08/16/2015   HGB 9.3* 08/16/2015   HCT 27.4* 08/16/2015   MCV 91.2 08/16/2015   PLT 320 08/16/2015   CMP Latest Ref Rng 08/15/2015  Glucose 65 - 99 mg/dL 116(H)  BUN 6 - 20 mg/dL 8  Creatinine 0.44 - 1.00 mg/dL 0.60  Sodium 135 - 145 mmol/L 140  Potassium 3.5 - 5.1 mmol/L 3.6  Chloride 101 - 111 mmol/L 106  CO2 22 - 32 mmol/L 24  Calcium 8.9 - 10.3 mg/dL 8.8(L)  Total Protein 6.5 - 8.1 g/dL 7.1  Total Bilirubin 0.3 - 1.2 mg/dL 0.6  Alkaline Phos 38 - 126 U/L 110  AST 15 - 41 U/L 41  ALT 14 - 54 U/L 47    Discharge instruction: per After Visit Summary and "Baby and Me Booklet".  After visit meds:    Medication List    TAKE these  medications        acetaminophen 500 MG tablet  Commonly known as:  TYLENOL  Take 1,000 mg by mouth every 6 (six) hours as needed for mild pain or moderate pain.     docusate sodium 100 MG capsule  Commonly known as:  COLACE  Take 100 mg by mouth 2 (two) times daily.     esomeprazole 40 MG capsule  Commonly known as:  NEXIUM  Take 1 capsule (40 mg total) by mouth daily at 12 noon.     ferrous sulfate 325 (65 FE) MG tablet  Take 1 tablet (325 mg total) by mouth daily with breakfast.     ibuprofen 600 MG tablet  Commonly known as:  ADVIL,MOTRIN  Take 1 tablet (600 mg total) by mouth every 6 (six) hours.     prenatal multivitamin Tabs tablet  Take 1 tablet by mouth daily at 12 noon.        Diet: routine diet  Activity: Advance as tolerated. Pelvic rest for 6 weeks.   Outpatient follow up:2 weeks Follow up Appt:No future appointments. Follow up Visit:No Follow-up on file.    Disposition:home   08/23/2015 Rubie Maid, MD

## 2016-01-28 IMAGING — RF DG HYSTEROGRAM
4 series · 4 of 4 positions shown · non-contrast
Comparison: None.

CLINICAL DATA: Primary infertility.

EXAM:
HYSTEROSALPINGOGRAM
TECHNIQUE: Hysterosalpingogram was performed by the ordering physician under
fluoroscopy. Fluoroscopic images were submitted for radiologic
interpretation following the procedure. Please see the procedural
report for the amount of contrast and the fluoroscopy time utilized.

[Series 1: run · 1 of 1 slices shown (1 of 4)]
[im 1/1]
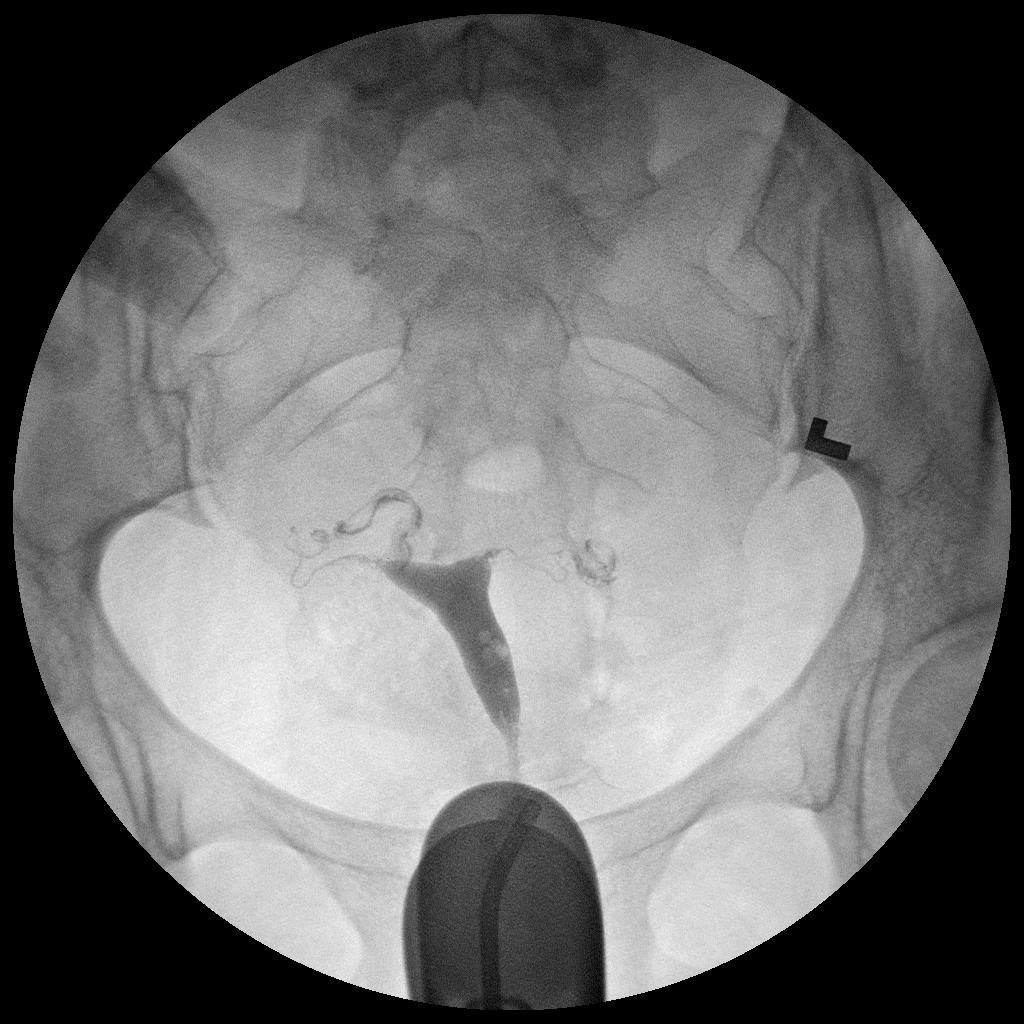

[Series 2: run · 1 of 1 slices shown (2 of 4)]
[im 1/1]
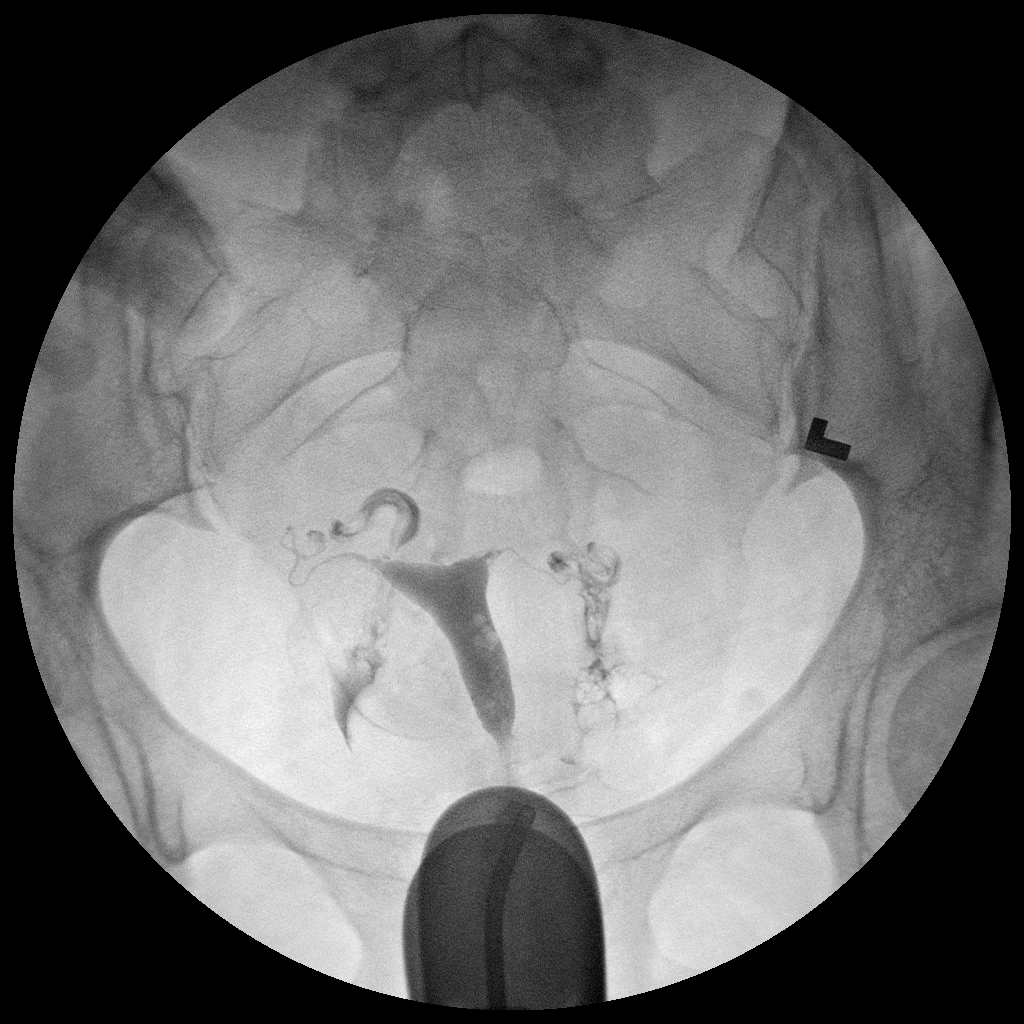

[Series 3: run · 1 of 1 slices shown (3 of 4)]
[im 1/1]
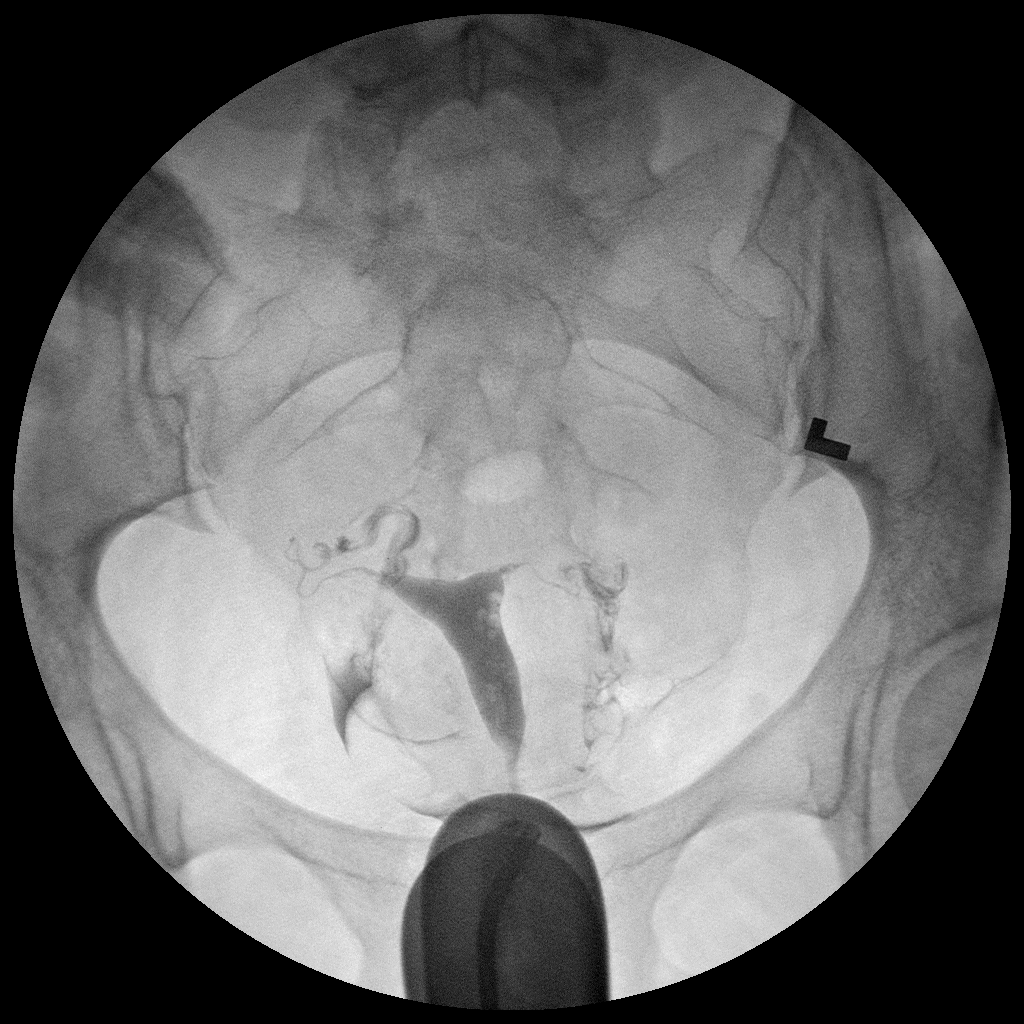

[Series 4: run · 1 of 1 slices shown (4 of 4)]
[im 1/1]
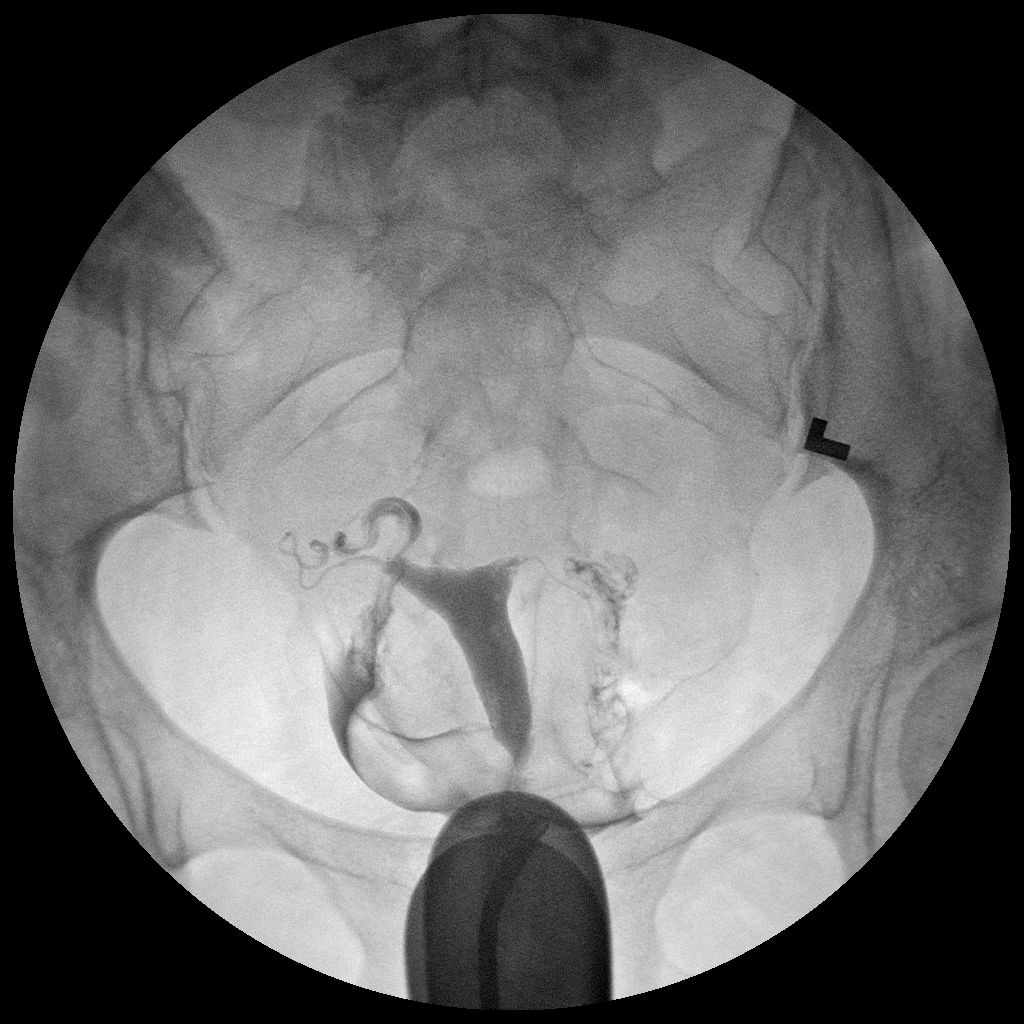

[4 of 4 positions shown; findings below may reference images not displayed]

FINDINGS: Endometrial Cavity: Normal contour. No signs of Mullerian duct
anomaly or other significant abnormality. Multiple tiny injected air
bubbles incidentally noted.

Right Fallopian Tube: Well opacified and normal in appearance. Free
intraperitoneal spill of contrast is demonstrated.

Left Fallopian Tube: Well opacified and normal in appearance. Free
intraperitoneal spill of contrast is demonstrated.

Other:  None.
IMPRESSION: Normal study.  Both fallopian tubes are patent.

## 2016-10-29 IMAGING — US US MFM OB COMP +14 WKS
1 series · 14 of 28 positions shown · non-contrast
Comparison: none

[Series 1: us mfm ob comp +14 wks · 72 acquisitions, 14 frames shown]
[im 3/72]
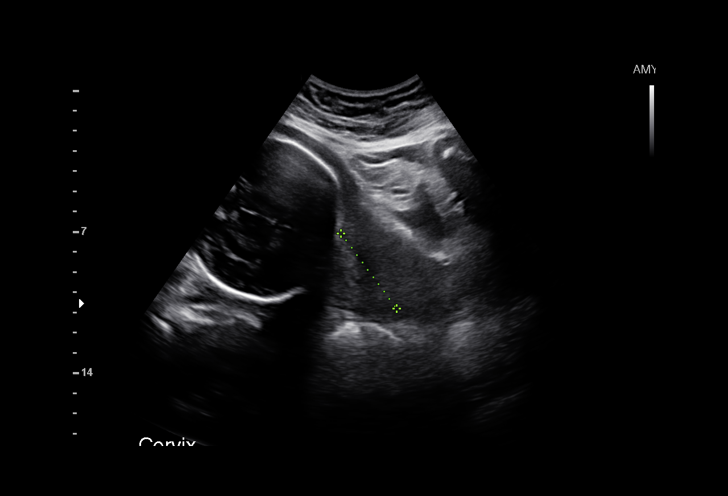
[im 8/72]
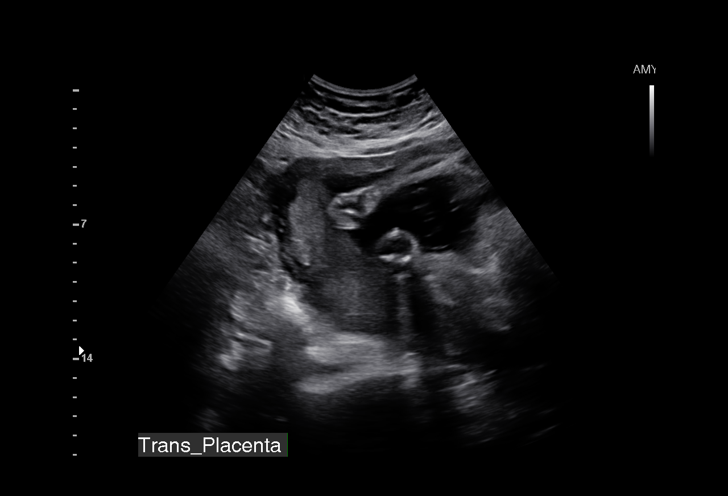
[im 14/72]
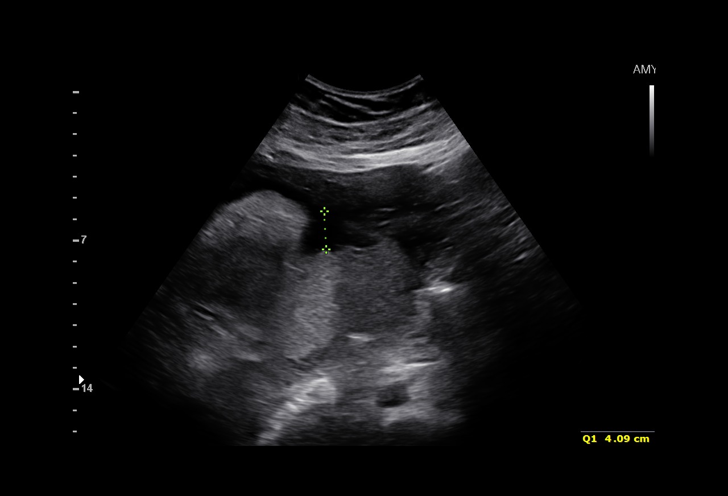
[im 19/72]
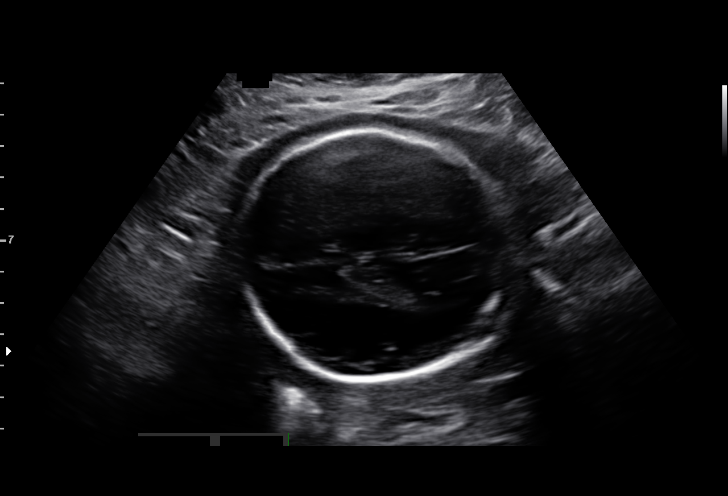
[im 24/72]
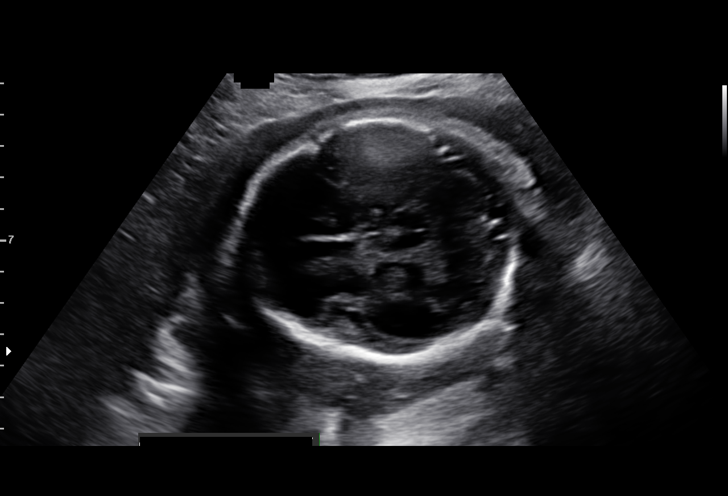
[im 29/72]
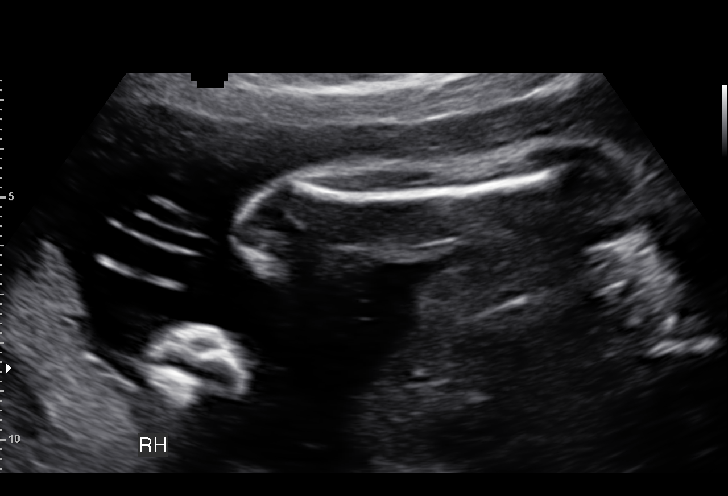
[im 35/72]
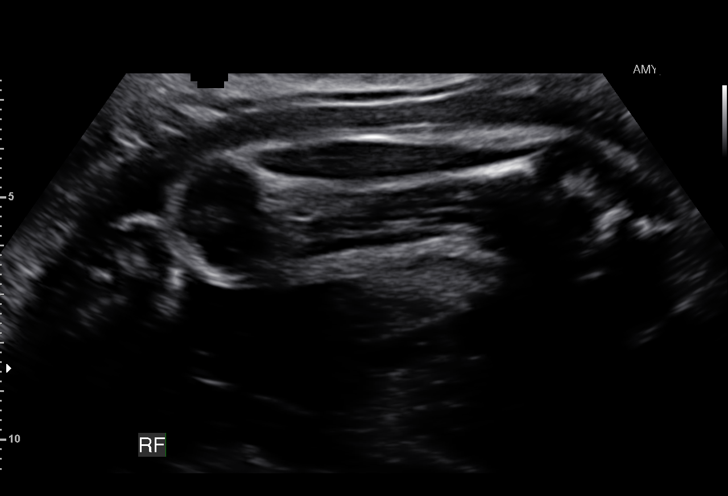
[im 40/72]
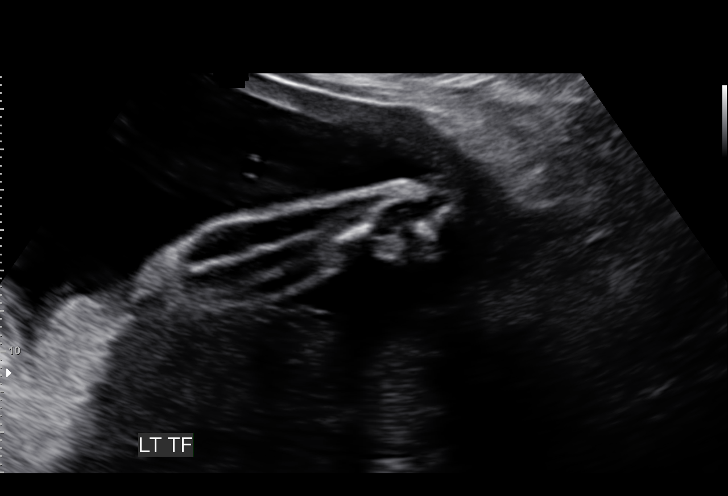
[im 45/72]
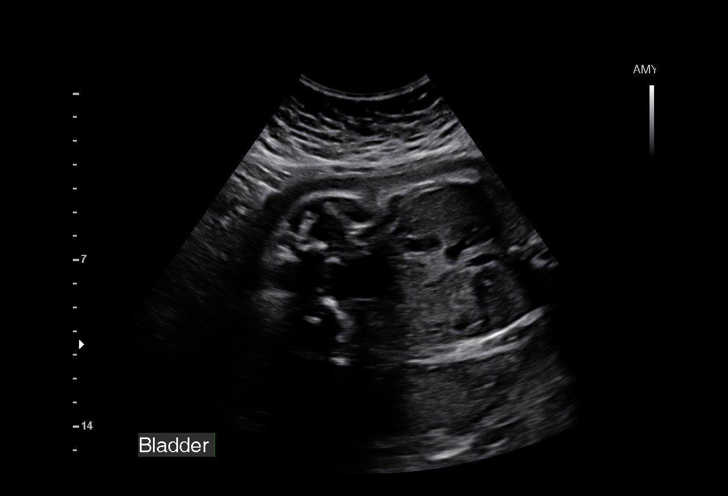
[im 50/72]
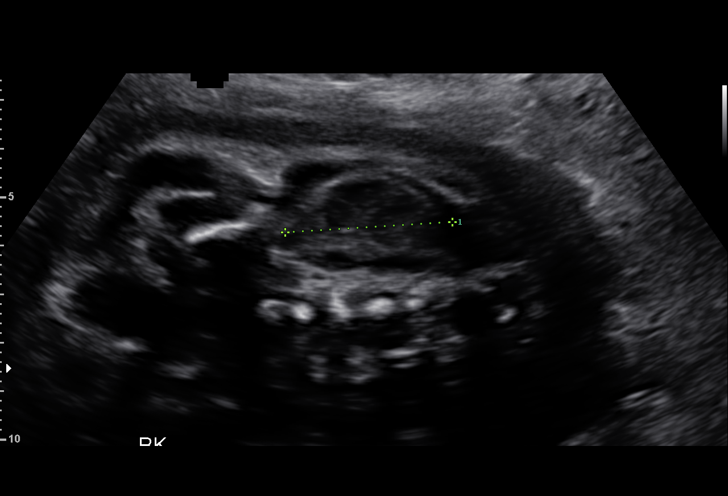
[im 56/72]
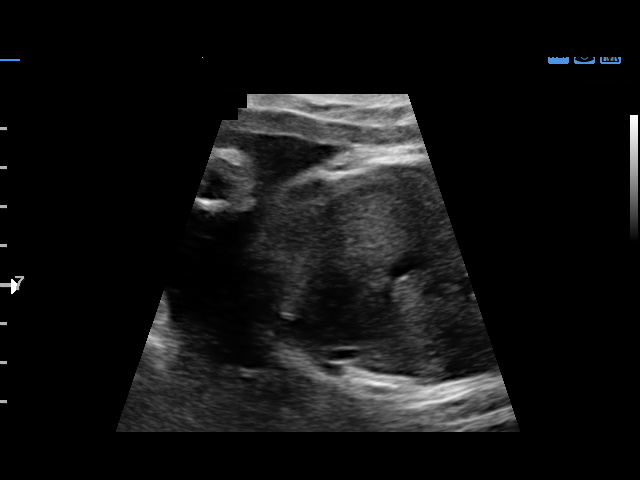
[im 61/72]
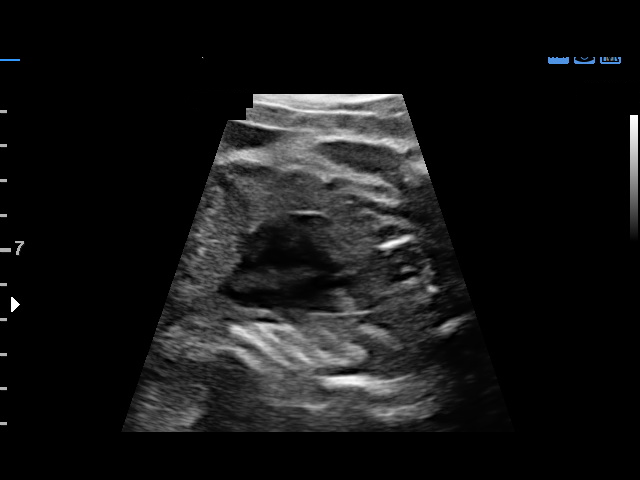
[im 66/72]
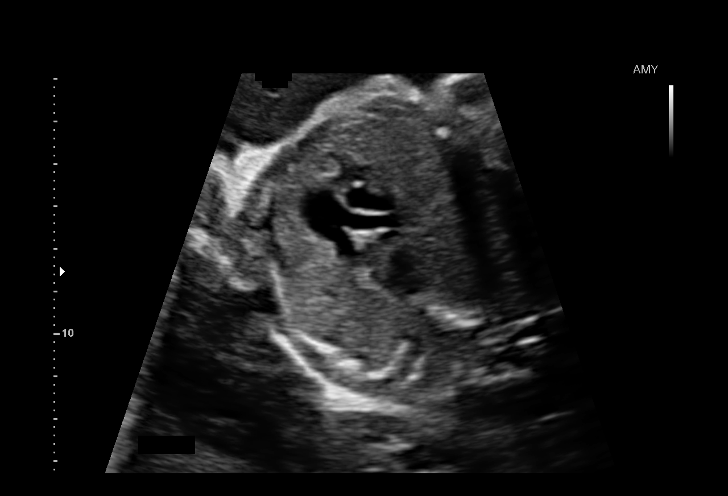
[im 72/72]
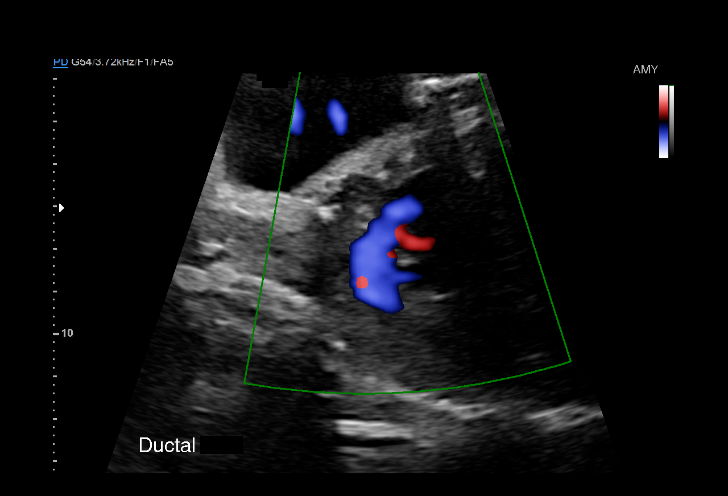

[14 of 28 positions shown; findings below may reference images not displayed]

[REDACTED]. [HOSPITAL]
DO

1  JOSIBERTO GILZ             [id number]      [id number]     [id number]
Indications

29 weeks gestation of pregnancy
Basic anatomic survey                          Z36
OB History

Gravidity:    1         Term:   0        Prem:   0         SAB:   0
TOP:          0       Ectopic:  0        Living: 0
Fetal Evaluation

Num Of Fetuses:     1
Fetal Heart         169
Rate(bpm):
Cardiac Activity:   Observed
Presentation:       Cephalic
Placenta:           Posterior, above cervical os
P. Cord Insertion:  Visualized

Amniotic Fluid
AFI FV:      Subjectively within normal limits
AFI Sum:     10.94    cm      20  %Tile      Larg Pckt:   4.09  cm
RUQ:   4.09    cm   RLQ:    2.65   cm    LUQ:   1.79    cm    LLQ:   2.41    cm
Biometry

BPD:      79.9  mm     G. Age:  32w 0d                  CI:         77.55  %    70 - 86
FL/HC:       20.4  %    19.6 -
HC:      287.2  mm     G. Age:  31w 4d         84  %    HC/AC:       1.11       0.99 -
AC:      258.5  mm     G. Age:  30w 0d         70  %    FL/BPD:      73.2  %    71 - 87
FL:       58.5  mm     G. Age:  30w 4d         75  %    FL/AC:       22.6  %    20 - 24
HUM:        55  mm     G. Age:  32w 0d       > 95  %
CER:      35.2  mm     G. Age:  30w 1d         72  %
LV:        5.6  mm
Est. FW:    4317   gm     3 lb 8 oz     76  %
Gestational Age

U/S Today:     31w 0d                                        EDD:    07/29/15
Best:          29w 1d     Det. By:  Previous Ultrasound      EDD:    08/11/15
Anatomy

Cranium:          Appears normal         LVOT:             Appears normal
Fetal Cavum:      Appears normal         Aortic Arch:      Appears normal
Ventricles:       Appears normal         Ductal Arch:      Appears normal
Choroid Plexus:   Appears normal         Diaphragm:        Appears normal
Cerebellum:       Appears normal         Stomach:          Appears normal, left
sided
Posterior Fossa:  Appears normal         Abdomen:          Appears normal
Nuchal Fold:      Not applicable (>20    Abdominal Wall:   Not well visualized
wks GA)
Face:             Appears normal         Cord Vessels:     Appears normal (3
(orbits and profile)                     vessel cord)
Lips:             Appears normal         Kidneys:          Appear normal
Palate:           Not well visualized    Bladder:          Appears normal
Fetal Thoracic:   Appears normal         Spine:            Not well visualized
Heart:            Appears normal         Upper             Visualized
(4CH, axis, and        Extremities:
situs)
RVOT:             Appears normal         Lower             Appears normal
Extremities:

Other:  Fetus appears to be a female. Heels appear normal.
Cervix Uterus Adnexa

Cervix
Length:           4.63  cm.
Normal appearance by transabdominal scan. Appears closed, without
funnelling.

Left Ovary
Not visualized. No adnexal mass visualized.

Right Ovary
Not visualized. No adnexal mass visualized.
Impression

SIUP at 29+1 weeks
Normal detailed fetal anatomy; all heart views seen and
appeared normal; limited views of CI and spine
Normal amniotic fluid volume
Measurements consistent with prior US; EFW at the 76th
%tile
Recommendations

Follow-up as clinically indicated

## 2016-12-07 LAB — HM PAP SMEAR: HM Pap smear: NEGATIVE

## 2017-01-10 ENCOUNTER — Emergency Department
Admission: EM | Admit: 2017-01-10 | Discharge: 2017-01-10 | Disposition: A | Discharge status: Home / Self Care | Payer: BC Managed Care – PPO | Attending: Emergency Medicine | Admitting: Emergency Medicine

## 2017-01-10 ENCOUNTER — Encounter: Payer: Self-pay | Admitting: Emergency Medicine

## 2017-01-10 DIAGNOSIS — K649 Unspecified hemorrhoids: Secondary | ICD-10-CM | POA: Diagnosis not present

## 2017-01-10 DIAGNOSIS — K6289 Other specified diseases of anus and rectum: Secondary | ICD-10-CM

## 2017-01-10 DIAGNOSIS — Z791 Long term (current) use of non-steroidal anti-inflammatories (NSAID): Secondary | ICD-10-CM | POA: Insufficient documentation

## 2017-01-10 DIAGNOSIS — K5900 Constipation, unspecified: Secondary | ICD-10-CM | POA: Insufficient documentation

## 2017-01-10 DIAGNOSIS — Z85828 Personal history of other malignant neoplasm of skin: Secondary | ICD-10-CM | POA: Diagnosis not present

## 2017-01-10 DIAGNOSIS — Z91040 Latex allergy status: Secondary | ICD-10-CM | POA: Diagnosis not present

## 2017-01-10 DIAGNOSIS — Z79899 Other long term (current) drug therapy: Secondary | ICD-10-CM | POA: Diagnosis not present

## 2017-01-10 MED ORDER — HYDROCORTISONE ACE-PRAMOXINE 1-1 % RE FOAM
1.0000 | Freq: Two times a day (BID) | RECTAL | Status: DC
  Filled 2017-01-10: qty 10

## 2017-01-10 MED ORDER — PHENYLEPHRINE IN HARD FAT 0.25 % RE SUPP
1.0000 | Freq: Two times a day (BID) | RECTAL | Status: DC
  Filled 2017-01-10: qty 1

## 2017-01-10 MED ORDER — WITCH HAZEL-GLYCERIN EX PADS
MEDICATED_PAD | CUTANEOUS | Status: DC | PRN
Start: 2017-01-10 — End: 2017-01-10
  Filled 2017-01-10: qty 100

## 2017-01-10 MED ORDER — HYDROCORTISONE ACETATE 25 MG RE SUPP: 25.0000 mg | Freq: Two times a day (BID) | RECTAL | 0 refills | Status: DC | PRN

## 2017-01-10 NOTE — ED Provider Notes (Signed)
[facility] Emergency Department Provider Note ____________________________________________   I have reviewed the triage vital signs and the triage nursing note.  HISTORY  Chief Complaint Rectal Pain   Historian Patient  HPI Dana Hatfield is a 31 y.o. female presenting with hard stool, constipation for which she started taking MiraLAX and Colace,, over the last 24 hours she had severe rectal pain which feels sharp.  She has small amount of rectal bleeding with bowel movement yesterday.  No abdominal pain.  She did have a fever yesterday of 102 with some chills, no upper respiratory symptoms, urinary symptoms, or skin rash.  Pain is waxing and waning at times is severe.  It is worse when she has to try to pass a bowel movement.   Past Medical History:  Diagnosis Date  . Allergy   . Infertility, female   . Melanoma ([place]) 2016   atypical nevis removed  . Ovarian cyst   . Vaginal Pap smear, abnormal     Patient Active Problem List   Diagnosis Date Noted  . Postpartum endometritis 08/15/2015  . Pregnancy 08/10/2015  . Indication for care in labor or delivery 08/09/2015    Past Surgical History:  Procedure Laterality Date  . NO PAST SURGERIES      Prior to Admission medications   Medication Sig Start Date End Date Taking? Authorizing Provider  acetaminophen (TYLENOL) 500 MG tablet Take 1,000 mg by mouth every 6 (six) hours as needed for mild pain or moderate pain.    [provider]  docusate sodium (COLACE) 100 MG capsule Take 100 mg by mouth 2 (two) times daily.    [provider]  esomeprazole (NEXIUM) 40 MG capsule Take 1 capsule (40 mg total) by mouth daily at 12 noon. 08/12/15   Juanda Chance, NP  ferrous sulfate 325 (65 FE) MG tablet Take 1 tablet (325 mg total) by mouth daily with breakfast. 08/16/15   Rubie Maid, MD  hydrocortisone (ANUSOL-HC) 25 MG suppository Place 1 suppository (25 mg total) rectally 2 (two) times daily as  needed for hemorrhoids. 01/10/17   Lisa Roca, MD  ibuprofen (ADVIL,MOTRIN) 600 MG tablet Take 1 tablet (600 mg total) by mouth every 6 (six) hours. 08/12/15   Juanda Chance, NP  Prenatal Vit-Fe Fumarate-FA (PRENATAL MULTIVITAMIN) TABS tablet Take 1 tablet by mouth daily at 12 noon.     [provider]    Allergies  Allergen Reactions  . Latex Itching    Family History  Problem Relation Age of Onset  . Parkinson's disease Mother   . Arthritis/Rheumatoid Mother   . Rheum arthritis Mother   . Melanoma Mother   . Ovarian cancer Maternal Grandmother   . Cancer Paternal Grandmother        liver  . Melanoma Sister   . Heart disease Paternal Grandfather   . Melanoma Paternal Grandfather   . Alcohol abuse Neg Hx   . Arthritis Neg Hx   . Asthma Neg Hx   . Birth defects Neg Hx   . COPD Neg Hx   . Depression Neg Hx   . Diabetes Neg Hx   . Drug abuse Neg Hx   . Early death Neg Hx   . Hearing loss Neg Hx   . Hyperlipidemia Neg Hx   . Hypertension Neg Hx   . Kidney disease Neg Hx   . Learning disabilities Neg Hx   . Mental illness Neg Hx   . Mental retardation Neg Hx   .  Miscarriages / Stillbirths Neg Hx   . Stroke Neg Hx   . Vision loss Neg Hx   . Varicose Veins Neg Hx     Social History Social History  Substance Use Topics  . Smoking status: Never Smoker  . Smokeless tobacco: Never Used  . Alcohol use No    Review of Systems  Constitutional: Fever over the past 24 hours up to 102, no associated symptoms. Eyes: Negative for visual changes. ENT: Negative for sore throat. Cardiovascular: Negative for chest pain. Respiratory: Negative for shortness of breath. Gastrointestinal: Active pain without any abdominal pain, positive for constipation as per HPI. Genitourinary: Negative for dysuria. Musculoskeletal: Negative for back pain. Skin: Negative for rash. Neurological: Negative for headache.  ____________________________________________   PHYSICAL  EXAM:  VITAL SIGNS: ED Triage Vitals  Enc Vitals Group     BP 01/10/17 0816 (!) 140/97     Pulse Rate 01/10/17 0816 (!) 106     Resp 01/10/17 0816 20     Temp 01/10/17 0816 98.4 F (36.9 C)     Temp Source 01/10/17 0816 Oral     SpO2 01/10/17 0816 97 %     Weight 01/10/17 0817 225 lb (102.1 kg)     Height 01/10/17 0817 5\' 5"  (1.651 m)     Head Circumference --      Peak Flow --      Pain Score 01/10/17 0816 10     Pain Loc --      Pain Edu? --      Excl. in [place]? --      Constitutional: Alert and oriented. Well appearing and in no distress. HEENT   Head: Normocephalic and atraumatic.      Eyes: Conjunctivae are normal. Pupils equal and round.       Ears:         Nose: No congestion/rhinnorhea.   Mouth/Throat: Mucous membranes are moist.   Neck: No stridor. Cardiovascular/Chest: Peripheral pulses. Respiratory: Normal respiratory effort without tachypnea nor retractions. Gastrointestinal: Soft. No distention, no guarding, no rebound. Nontender.   Genitourinary/rectal: Small nonthrombosed external hemorrhoids.  No obvious fissures or lesions or rash, but the patient is tender to the touch along the skin of the rectum, with tenderness with rectal exam.  No masses palpated.  She does have a somewhat hard stool in the rectal vault. Musculoskeletal: Nontender with normal range of motion in all extremities. No joint effusions.  No lower extremity tenderness.  No edema. Neurologic:  Normal speech and language. No gross or focal neurologic deficits are appreciated. Skin:  Skin is warm, dry and intact. No rash noted. Psychiatric: Mood and affect are normal. Speech and behavior are normal. Patient exhibits appropriate insight and judgment.   ____________________________________________  LABS (pertinent positives/negatives) I, Lisa Roca, MD the attending physician have reviewed the labs noted below.  Labs Reviewed - No data to  display  ____________________________________________    EKG I, Lisa Roca, MD, the attending physician have personally viewed and interpreted all ECGs.  None ____________________________________________  RADIOLOGY All Xrays were viewed by me.  Imaging interpreted by Radiologist, and I, Lisa Roca, MD the attending physician have reviewed the radiologist interpretation noted below.  None __________________________________________  PROCEDURES  Procedure(s) performed: None  Critical Care performed: None  ____________________________________________  No current facility-administered medications on file prior to encounter.    Current Outpatient Prescriptions on File Prior to Encounter  Medication Sig Dispense Refill  . acetaminophen (TYLENOL) 500 MG tablet Take 1,000 mg by  mouth every 6 (six) hours as needed for mild pain or moderate pain.    Marland Kitchen docusate sodium (COLACE) 100 MG capsule Take 100 mg by mouth 2 (two) times daily.    Marland Kitchen esomeprazole (NEXIUM) 40 MG capsule Take 1 capsule (40 mg total) by mouth daily at 12 noon. 30 capsule 1  . ferrous sulfate 325 (65 FE) MG tablet Take 1 tablet (325 mg total) by mouth daily with breakfast. 30 tablet 2  . ibuprofen (ADVIL,MOTRIN) 600 MG tablet Take 1 tablet (600 mg total) by mouth every 6 (six) hours. 30 tablet 0  . Prenatal Vit-Fe Fumarate-FA (PRENATAL MULTIVITAMIN) TABS tablet Take 1 tablet by mouth daily at 12 noon.       ____________________________________________  ED COURSE / ASSESSMENT AND PLAN  Pertinent labs & imaging results that were available during my care of the patient were reviewed by me and considered in my medical decision making (see chart for details).    Clinically patient symptoms sound like fissure or hemorrhoids with recent constipation.  On exam, she does have some external hemorrhoids which are not thrombosed, I suspect may be hemorrhoid is more internal.  She does have hard stool in the rectal vault,  but I was able to be deflated somewhat.  At this point I do not have a high suspicion for rectal mass, or rectal abscess.  She does report a fever over the last 24 hours, but no other systemic symptoms and no abdominal pain, no bulging or tender mass on digital rectal exam.  No history of chrons or ulcerative colitis.  I think rectal abscess or internal abscess would be quite unlikely.  We discussed that her symptoms seem most likely still consistent with symptomatic hemorrhoids with associated constipation.  I will treat symptomatically for this.  She understands return precautions.    CONSULTATIONS:  None   Patient / Family / Caregiver informed of clinical course, medical decision-making process, and agree with plan.   I discussed return precautions, follow-up instructions, and discharge instructions with patient and/or family.  Discharge Instructions : You are evaluated for rectal pain, and I suspect hemorrhoids/fissure and associated with constipation.  For symptom relief - take over the counter ibuprofen 600mg  every 8 hours as needed for pain and antiinflammation.  And Try Over the Counter: 1. "Witch hazel" pads, up to 6 times per day (use as directed on label.) 2. Phenylephrine 0.25% (Preparation H) - ointment or suppository - up to 4 times daily (use as directed on label.) 3. Pramoxine 1% rectal foam, ointment or wipes - up to five times daily (use as directed on label)  For constipation, continue MiraLAX and Colace.  Make sure you drink plenty of fluids.  In terms of the reported fever, uncertain etiology, but at this point does not necessarily seem to be related to the rectal pain.    Return to the emergency room immediately for any new or worsening condition, including uncontrolled rectal pain, any stated abdominal pain, any worsening rectal bleeding, dizziness, passing out, persistent fevers.    ___________________________________________   FINAL CLINICAL IMPRESSION(S)  / ED DIAGNOSES   Final diagnoses:  Rectal pain  Constipation, unspecified constipation type  Acute hemorrhoid              Note: This dictation was prepared with Dragon dictation. Any transcriptional errors that result from this process are unintentional    Lisa Roca, MD 01/10/17 [phone]

## 2017-01-10 NOTE — Discharge Instructions (Signed)
You are evaluated for rectal pain, and I suspect hemorrhoids/fissure and associated with constipation.  For symptom relief - take over the counter ibuprofen 600mg  every 8 hours as needed for pain and antiinflammation.  And Try Over the Counter: 1. "Witch hazel" pads, up to 6 times per day (use as directed on label.) 2. Phenylephrine 0.25% (Preparation H) - ointment or suppository - up to 4 times daily (use as directed on label.) 3. Pramoxine 1% rectal foam, ointment or wipes - up to five times daily (use as directed on label)  For constipation, continue MiraLAX and Colace.  Make sure you drink plenty of fluids.  In terms of the reported fever, uncertain etiology, but at this point does not necessarily seem to be related to the rectal pain.    Return to the emergency room immediately for any new or worsening condition, including uncontrolled rectal pain, any stated abdominal pain, any worsening rectal bleeding, dizziness, passing out, persistent fevers.

## 2017-01-10 NOTE — ED Triage Notes (Signed)
Rectal pain since yesterday. Last BM 2 days ago and was normal.

## 2018-02-12 ENCOUNTER — Encounter: Payer: Self-pay | Admitting: Physician Assistant

## 2018-02-12 ENCOUNTER — Other Ambulatory Visit: Payer: Self-pay

## 2018-02-12 ENCOUNTER — Ambulatory Visit: Payer: BC Managed Care – PPO | Admitting: Physician Assistant

## 2018-02-12 VITALS — BP 128/70 | HR 87 | Temp 98.5°F | Resp 16 | Ht 65.0 in | Wt 239.0 lb

## 2018-02-12 DIAGNOSIS — Z1329 Encounter for screening for other suspected endocrine disorder: Secondary | ICD-10-CM | POA: Diagnosis not present

## 2018-02-12 DIAGNOSIS — Z0000 Encounter for general adult medical examination without abnormal findings: Secondary | ICD-10-CM

## 2018-02-12 DIAGNOSIS — Z131 Encounter for screening for diabetes mellitus: Secondary | ICD-10-CM

## 2018-02-12 DIAGNOSIS — Z8742 Personal history of other diseases of the female genital tract: Secondary | ICD-10-CM

## 2018-02-12 DIAGNOSIS — Z124 Encounter for screening for malignant neoplasm of cervix: Secondary | ICD-10-CM | POA: Diagnosis not present

## 2018-02-12 DIAGNOSIS — Z30011 Encounter for initial prescription of contraceptive pills: Secondary | ICD-10-CM

## 2018-02-12 DIAGNOSIS — Z13220 Encounter for screening for lipoid disorders: Secondary | ICD-10-CM

## 2018-02-12 DIAGNOSIS — Z130 Encounter for screening for diseases of the blood and blood-forming organs and certain disorders involving the immune mechanism: Secondary | ICD-10-CM

## 2018-02-12 MED ORDER — SPRINTEC 28 0.25-35 MG-MCG PO TABS: 1.0000 | ORAL_TABLET | Freq: Every day | ORAL | 3 refills | Status: DC

## 2018-02-12 NOTE — Progress Notes (Signed)
Patient: Dana Hatfield, Female    DOB: 18-Jun-1985, 32 y.o.   MRN: [id number] Visit Date: 02/12/2018  Today's Provider: Trinna Post, PA-C   Chief Complaint  Patient presents with  . New Patient (Initial Visit)  . Establish Care   Subjective:    Establish Care: Dana Hatfield is a 32 y.o. female who presents today establish care and birth control refill. Living in [place] currently, previously in [place]. Works as a Building control surveyor. Age 28.5 daughter - married for five years. Two dogs.   She was previously seen at [facility], had fertility issues and reports she had a PAP smear last year. She reports at one time she had an abnormal PAP smear, perhaps several years ago. She does not remember the results of her PAPs but says she required a colposcopy. She reports she never had a LEEP or cold knife conization. She reports she has not had abnormal PAP smears since then.   She is currently on combined oral contraceptive. She denies history of heart attack, blood clot, and stroke.  -----------------------------------------------------------------   Review of Systems  Constitutional: Negative.   HENT: Negative.   Eyes: Positive for visual disturbance.  Respiratory: Negative.   Cardiovascular: Negative.   Gastrointestinal: Negative.   Endocrine: Negative.   Genitourinary: Negative.   Musculoskeletal: Negative.   Skin: Negative.   Allergic/Immunologic: Negative.   Neurological: Negative.   Hematological: Negative.   Psychiatric/Behavioral: Negative.     Social History      She  reports that she has never smoked. She has never used smokeless tobacco. She reports that she does not drink alcohol or use drugs.       Social History   Socioeconomic History  . Marital status: Married    Spouse name: n/a  . Number of children: 0  . Years of education: 67  . Highest education level: Not on file  Occupational History  . Occupation: Product manager:  Primary school teacher    Comment: [employer]  . Financial resource strain: Not on file  . Food insecurity:    Worry: Not on file    Inability: Not on file  . Transportation needs:    Medical: Not on file    Non-medical: Not on file  Tobacco Use  . Smoking status: Never Smoker  . Smokeless tobacco: Never Used  Substance and Sexual Activity  . Alcohol use: No  . Drug use: No  . Sexual activity: Yes    Birth control/protection: Pill  Lifestyle  . Physical activity:    Days per week: Not on file    Minutes per session: Not on file  . Stress: Not on file  Relationships  . Social connections:    Talks on phone: Not on file    Gets together: Not on file    Attends religious service: Not on file    Active member of club or organization: Not on file    Attends meetings of clubs or organizations: Not on file    Relationship status: Not on file  Other Topics Concern  . Not on file  Social History Narrative   ** Merged History Encounter **        Past Medical History:  Diagnosis Date  . Allergy   . Infertility, female   . Melanoma ([place]) 2016   atypical nevis removed  . Ovarian cyst   . Vaginal Pap smear, abnormal  Patient Active Problem List   Diagnosis Date Noted  . Postpartum endometritis 08/15/2015  . Pregnancy 08/10/2015  . Indication for care in labor or delivery 08/09/2015    Past Surgical History:  Procedure Laterality Date  . NO PAST SURGERIES      Family History        Family Status  Relation Name Status  . Mother  Alive  . MGM  Deceased  . PGM  Deceased  . Sister  Alive  . PGF  Deceased  . Father  Alive  . Brother  Alive  . MGF  Deceased  . Neg Hx  (Not Specified)        Her family history includes Arthritis/Rheumatoid in her mother; Cancer in her paternal grandmother; Heart disease in her paternal grandfather; Melanoma in her mother, paternal grandfather, and sister; Ovarian cancer in her maternal  grandmother; Parkinson's disease in her mother; Rheum arthritis in her mother. There is no history of Alcohol abuse, Arthritis, Asthma, Birth defects, COPD, Depression, Diabetes, Drug abuse, Early death, Hearing loss, Hyperlipidemia, Hypertension, Kidney disease, Learning disabilities, Mental illness, Mental retardation, Miscarriages / Stillbirths, Stroke, Vision loss, or Varicose Veins.      Allergies  Allergen Reactions  . Latex Itching     Current Outpatient Medications:  .  Sod Fluoride-Potassium Nitrate (PREVIDENT 5000 SENSITIVE) 1.1-5 % PSTE, BRUSH WITH TWICE DAILY, Disp: , Rfl:  .  SPRINTEC 28 0.25-35 MG-MCG tablet, , Disp: , Rfl:    Patient Care Team: Paulene Floor as PCP - General (Physician Assistant)      Objective:   Vitals: BP 128/70 (BP Location: Left Arm, Patient Position: Sitting, Cuff Size: Large)   Pulse 87   Temp 98.5 F (36.9 C) (Oral)   Resp 16   Ht 5\' 5"  (1.651 m)   Wt 239 lb (108.4 kg)   LMP  (Within Weeks)   SpO2 99%   BMI 39.77 kg/m    Vitals:   02/12/18 1428  BP: 128/70  Pulse: 87  Resp: 16  Temp: 98.5 F (36.9 C)  TempSrc: Oral  SpO2: 99%  Weight: 239 lb (108.4 kg)  Height: 5\' 5"  (1.651 m)     Physical Exam  Constitutional: She is oriented to person, place, and time. She appears well-developed and well-nourished.  HENT:  Head: Normocephalic.  Right Ear: External ear normal.  Left Ear: External ear normal.  Nose: Nose normal.  Mouth/Throat: Oropharynx is clear and moist.  Eyes: Pupils are equal, round, and reactive to light. Conjunctivae and EOM are normal.  Neck: Normal range of motion. Neck supple.  Cardiovascular: Normal rate, regular rhythm, normal heart sounds and intact distal pulses.  Pulmonary/Chest: Effort normal and breath sounds normal.  Abdominal: Soft. Bowel sounds are normal.  Genitourinary: No labial fusion. There is no rash, tenderness, lesion or injury on the right labia. There is no rash, tenderness, lesion  or injury on the left labia. Cervix exhibits no motion tenderness, no discharge and no friability. Right adnexum displays no mass, no tenderness and no fullness. Left adnexum displays no mass, no tenderness and no fullness. No erythema, tenderness or bleeding in the vagina. No foreign body in the vagina. No signs of injury around the vagina. No vaginal discharge found.  Musculoskeletal: Normal range of motion.  Neurological: She is alert and oriented to person, place, and time.  Skin: Skin is warm and dry.  Psychiatric: She has a normal mood and affect. Her behavior is normal. Judgment and thought  content normal.     Depression Screen PHQ 2/9 Scores 02/12/2018  PHQ - 2 Score 0      Assessment & Plan:     Routine Health Maintenance and Physical Exam  Exercise Activities and Dietary recommendations Goals   None     Immunization History  Administered Date(s) Administered  . Influenza,inj,Quad PF,6+ Mos 10/28/2017    Health Maintenance  Topic Date Due  . TETANUS/TDAP  03/17/2005  . PAP SMEAR  03/18/2007  . INFLUENZA VACCINE  10/11/2017  . HIV Screening  Completed     Discussed health benefits of physical activity, and encouraged her to engage in regular exercise appropriate for her age and condition.    1. Annual physical exam   2. Cervical cancer screening  She had PAP last year but due to her history she would like another one this year. Will request records to find out PAP smear results and have this in our system.  - Pap IG and HPV (high risk) DNA detection  3. Diabetes mellitus screening  - Comprehensive metabolic panel - Hemoglobin A1c  4. Thyroid disorder screening  - TSH  5. History of abnormal cervical Pap smear   6. Screening for deficiency anemia  - CBC with Differential/Platelet  7. Screening cholesterol level  - Lipid panel  8. Encounter for initial prescription of contraceptive pills  - SPRINTEC 28 0.25-35 MG-MCG tablet; Take 1 tablet  by mouth daily.  Dispense: 3 Package; Refill: 3  Return in about 1 year (around 02/13/2019) for CPE.  The entirety of the information documented in the History of Present Illness, Review of Systems and Physical Exam were personally obtained by me. Portions of this information were initially documented by Joseline Rosas,CMA and reviewed by me for thoroughness and accuracy.      --------------------------------------------------------------------    Trinna Post, PA-C  [facility]

## 2018-02-13 ENCOUNTER — Telehealth: Payer: Self-pay

## 2018-02-13 LAB — CBC WITH DIFFERENTIAL/PLATELET
Basophils Absolute: 0.1 10*3/uL (ref 0.0–0.2)
Basos: 1 %
EOS (ABSOLUTE): 0.1 10*3/uL (ref 0.0–0.4)
Eos: 2 %
Hematocrit: 38.7 % (ref 34.0–46.6)
Hemoglobin: 13.4 g/dL (ref 11.1–15.9)
Immature Grans (Abs): 0 10*3/uL (ref 0.0–0.1)
Immature Granulocytes: 0 %
Lymphocytes Absolute: 2.7 10*3/uL (ref 0.7–3.1)
Lymphs: 32 %
MCH: 30.4 pg (ref 26.6–33.0)
MCHC: 34.6 g/dL (ref 31.5–35.7)
MCV: 88 fL (ref 79–97)
Monocytes Absolute: 0.5 10*3/uL (ref 0.1–0.9)
Monocytes: 6 %
Neutrophils Absolute: 5 10*3/uL (ref 1.4–7.0)
Neutrophils: 59 %
Platelets: 341 10*3/uL (ref 150–450)
RBC: 4.41 x10E6/uL (ref 3.77–5.28)
RDW: 12.2 % — ABNORMAL LOW (ref 12.3–15.4)
WBC: 8.5 10*3/uL (ref 3.4–10.8)

## 2018-02-13 LAB — LIPID PANEL
Chol/HDL Ratio: 3.2 ratio (ref 0.0–4.4)
Cholesterol, Total: 189 mg/dL (ref 100–199)
HDL: 60 mg/dL (ref >39)
LDL Calculated: 98 mg/dL (ref 0–99)
Triglycerides: 153 mg/dL — ABNORMAL HIGH (ref 0–149)
VLDL Cholesterol Cal: 31 mg/dL (ref 5–40)

## 2018-02-13 LAB — COMPREHENSIVE METABOLIC PANEL
ALT: 13 IU/L (ref 0–32)
AST: 13 IU/L (ref 0–40)
Albumin/Globulin Ratio: 1.5 (ref 1.2–2.2)
Albumin: 4.3 g/dL (ref 3.5–5.5)
Alkaline Phosphatase: 62 IU/L (ref 39–117)
BUN/Creatinine Ratio: 24 — ABNORMAL HIGH (ref 9–23)
BUN: 16 mg/dL (ref 6–20)
Bilirubin Total: <0.2 mg/dL (ref 0.0–1.2)
CO2: 21 mmol/L (ref 20–29)
Calcium: 9.4 mg/dL (ref 8.7–10.2)
Chloride: 100 mmol/L (ref 96–106)
Creatinine, Ser: 0.68 mg/dL (ref 0.57–1.00)
GFR calc Af Amer: 135 mL/min/{1.73_m2} (ref >59)
GFR calc non Af Amer: 117 mL/min/{1.73_m2} (ref >59)
Globulin, Total: 2.9 g/dL (ref 1.5–4.5)
Glucose: 94 mg/dL (ref 65–99)
Potassium: 4.1 mmol/L (ref 3.5–5.2)
Sodium: 137 mmol/L (ref 134–144)
Total Protein: 7.2 g/dL (ref 6.0–8.5)

## 2018-02-13 LAB — TSH: TSH: 2 u[IU]/mL (ref 0.450–4.500)

## 2018-02-13 LAB — HEMOGLOBIN A1C
Est. average glucose Bld gHb Est-mCnc: 111 mg/dL
Hgb A1c MFr Bld: 5.5 % (ref 4.8–5.6)

## 2018-02-13 NOTE — Telephone Encounter (Signed)
[id number] 

## 2018-02-13 NOTE — Telephone Encounter (Signed)
Patient advised as directed below. 

## 2018-02-13 NOTE — Telephone Encounter (Signed)
-----   Message from Trinna Post, [place] sent at 02/13/2018  4:04 PM EST ----- Labwork all normal.

## 2018-02-14 LAB — PAP IG AND HPV HIGH-RISK
HPV, high-risk: NEGATIVE
PAP Smear Comment: 0

## 2018-02-15 ENCOUNTER — Telehealth: Payer: Self-pay

## 2018-02-15 NOTE — Telephone Encounter (Signed)
-----   Message from Trinna Post, [place] sent at 02/15/2018  1:36 PM EST ----- PAP negative and HPV negative. Awaiting OBGYN records to determine when her next pap should be.

## 2018-02-15 NOTE — Telephone Encounter (Signed)
LMTCB

## 2018-02-19 NOTE — Telephone Encounter (Signed)
LMTCB

## 2018-03-01 ENCOUNTER — Encounter: Payer: Self-pay | Admitting: Physician Assistant

## 2018-04-18 ENCOUNTER — Encounter: Payer: Self-pay | Admitting: Physician Assistant

## 2018-04-18 ENCOUNTER — Ambulatory Visit: Payer: BC Managed Care – PPO | Admitting: Physician Assistant

## 2018-04-18 VITALS — BP 141/95 | HR 85 | Temp 99.1°F | Wt 240.8 lb

## 2018-04-18 DIAGNOSIS — Z20828 Contact with and (suspected) exposure to other viral communicable diseases: Secondary | ICD-10-CM

## 2018-04-18 DIAGNOSIS — J101 Influenza due to other identified influenza virus with other respiratory manifestations: Secondary | ICD-10-CM

## 2018-04-18 LAB — POCT INFLUENZA A/B
Influenza A, POC: NEGATIVE
Influenza B, POC: POSITIVE — AB

## 2018-04-18 MED ORDER — OSELTAMIVIR PHOSPHATE 75 MG PO CAPS: 75.0000 mg | ORAL_CAPSULE | Freq: Two times a day (BID) | ORAL | 0 refills | Status: AC

## 2018-04-18 NOTE — Progress Notes (Signed)
Patient: Dana Hatfield Female    DOB: October 18, 1985   33 y.o.   MRN: [id number] Visit Date: 04/18/2018  Today's Provider: Trinna Post, PA-C   Chief Complaint  Patient presents with  . URI   Subjective:    URI   The current episode started in the past 7 days. The problem has been unchanged. The maximum temperature recorded prior to her arrival was 100.4 - 100.9 F. The fever has been present for 1 to 2 days. Associated symptoms include congestion, coughing, sinus pain and a sore throat (Due to coughing per patient.).   Has had flu vaccination. 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   BP Readings from Last 3 Encounters:  04/18/18 (!) 141/95  02/12/18 128/70  01/10/17 (!) 138/93    Allergies  Allergen Reactions  . Latex Itching     Current Outpatient Medications:  .  Sod Fluoride-Potassium Nitrate (PREVIDENT 5000 SENSITIVE) 1.1-5 % PSTE, BRUSH WITH TWICE DAILY, Disp: , Rfl:  .  SPRINTEC 28 0.25-35 MG-MCG tablet, Take 1 tablet by mouth daily., Disp: 3 Package, Rfl: 3 .  oseltamivir (TAMIFLU) 75 MG capsule, Take 1 capsule (75 mg total) by mouth 2 (two) times daily for 5 days., Disp: 10 capsule, Rfl: 0  Review of Systems  Constitutional: Positive for chills and fever.  HENT: Positive for congestion, postnasal drip, sinus pressure, sinus pain and sore throat (Due to coughing per patient.).   Respiratory: Positive for cough, chest tightness and shortness of breath.   Neurological: Negative.     Social History   Tobacco Use  . Smoking status: Never Smoker  . Smokeless tobacco: Never Used  Substance Use Topics  . Alcohol use: No      Objective:   BP (!) 141/95 (BP Location: Left Arm, Patient Position: Sitting, Cuff Size: Normal)   Pulse 85   Temp 99.1 F (37.3 C) (Oral)   Wt 240 lb 12.8 oz (109.2 kg)   SpO2 97%   BMI 40.07 kg/m  Vitals:   04/18/18 0816  BP: (!) 141/95  Pulse: 85  Temp: 99.1 F (37.3 C)  TempSrc: Oral  SpO2: 97%    Weight: 240 lb 12.8 oz (109.2 kg)     Physical Exam Constitutional:      Appearance: Normal appearance.  HENT:     Right Ear: Tympanic membrane and ear canal normal.     Left Ear: Tympanic membrane and ear canal normal.     Mouth/Throat:     Mouth: Mucous membranes are moist.     Pharynx: Oropharynx is clear.  Cardiovascular:     Rate and Rhythm: Normal rate and regular rhythm.     Heart sounds: Normal heart sounds.  Pulmonary:     Breath sounds: Normal breath sounds.  Abdominal:     General: Bowel sounds are normal.     Palpations: Abdomen is soft.  Skin:    General: Skin is warm and dry.  Neurological:     General: No focal deficit present.     Mental Status: She is alert and oriented to person, place, and time.  Psychiatric:        Mood and Affect: Mood normal.        Behavior: Behavior normal.         Assessment & Plan    1. Influenza B  - oseltamivir (TAMIFLU) 75 MG capsule; Take 1 capsule (75 mg total) by mouth 2 (two)  times daily for 5 days.  Dispense: 10 capsule; Refill: 0  2. Exposure to influenza  - POCT Influenza A/B  The entirety of the information documented in the History of Present Illness, Review of Systems and Physical Exam were personally obtained by me. Portions of this information were initially documented by Lynford Humphrey, CMA and reviewed by me for thoroughness and accuracy.   Return if symptoms worsen or fail to improve.     Trinna Post, PA-C  [facility]

## 2018-04-18 NOTE — Patient Instructions (Signed)

## 2019-02-27 ENCOUNTER — Other Ambulatory Visit: Payer: Self-pay | Admitting: Physician Assistant

## 2019-02-27 DIAGNOSIS — Z30011 Encounter for initial prescription of contraceptive pills: Secondary | ICD-10-CM

## 2019-02-27 NOTE — Telephone Encounter (Signed)
Requested medication (s) are due for refill today: yes  Requested medication (s) are on the active medication list: yes  Last refill:  11/27/2018  Future visit scheduled: no  Notes to clinic: LOV- 02/12/2018 Review for refill Overdue for cpe   Requested Prescriptions  Pending Prescriptions Disp Refills   Dana Hatfield 28 0.25-35 MG-MCG tablet [Pharmacy Med Name: [facility] 84 tablet 3    Sig: TAKE 1 TABLET BY MOUTH EVERY DAY      OB/GYN:  Contraceptives Failed - 02/27/2019  2:50 AM      Failed - Last BP in normal range    BP Readings from Last 1 Encounters:  04/18/18 (!) 141/95          Failed - Valid encounter within last 12 months    Recent Outpatient Visits           10 months ago [name], [facility] [place], [place]   1 year ago Annual physical exam   [name], [name], [place]   6 years ago Acute pharyngitis   Primary Care at [facility], [place], [place]

## 2019-02-28 NOTE — Telephone Encounter (Signed)
Please schedule once yearly follow up can be phone visit.

## 2019-03-03 NOTE — Telephone Encounter (Signed)
Patient states that she switched doctors due to insurance changes. FYI

## 2019-08-30 ENCOUNTER — Ambulatory Visit
Admission: EM | Admit: 2019-08-30 | Discharge: 2019-08-30 | Disposition: A | Discharge status: Home / Self Care | Attending: Internal Medicine | Admitting: Internal Medicine

## 2019-08-30 ENCOUNTER — Other Ambulatory Visit: Payer: Self-pay

## 2019-08-30 DIAGNOSIS — B9789 Other viral agents as the cause of diseases classified elsewhere: Secondary | ICD-10-CM | POA: Diagnosis not present

## 2019-08-30 DIAGNOSIS — J028 Acute pharyngitis due to other specified organisms: Secondary | ICD-10-CM | POA: Diagnosis present

## 2019-08-30 DIAGNOSIS — Z20822 Contact with and (suspected) exposure to covid-19: Secondary | ICD-10-CM | POA: Insufficient documentation

## 2019-08-30 DIAGNOSIS — J029 Acute pharyngitis, unspecified: Secondary | ICD-10-CM

## 2019-08-30 LAB — GROUP A STREP BY PCR: Group A Strep by PCR: NOT DETECTED

## 2019-08-30 MED ORDER — CEPACOL 15-2.3 MG MT LOZG: 1.0000 | LOZENGE | OROMUCOSAL | 0 refills | Status: AC | PRN

## 2019-08-30 NOTE — ED Triage Notes (Signed)
Patient states that she started having a sore throat x Thursday and has been taking tylenol and ibuprofen. States that she is not currently having any other symptoms but has Strep often.

## 2019-08-30 NOTE — ED Provider Notes (Signed)
[facility]    CSN: [id number] Arrival date & time: 08/30/19  0801      History   Chief Complaint Chief Complaint  Patient presents with   Sore Throat    HPI Dana Hatfield is a 34 y.o. female comes to urgent care with complaint of sore throat which started 2 to 3 days ago.  Patient denies any runny nose, fever or chills.  She has difficulty swallowing.  No shortness of breath, cough or sputum production.  No chest pain or chest pressure.  No generalized body aches.  Patient's daughter is on Augmentin at this time for chronic tonsillitis.  No loss of taste or smell.  No diarrhea.  Patient is fully vaccinated against COVID-19 virus. HPI  Past Medical History:  Diagnosis Date   Allergy    Infertility, female    Melanoma ([place]) 2016   atypical nevis removed   Ovarian cyst    Vaginal Pap smear, abnormal     Patient Active Problem List   Diagnosis Date Noted   Postpartum endometritis 08/15/2015   Pregnancy 08/10/2015   Indication for care in labor or delivery 08/09/2015    Past Surgical History:  Procedure Laterality Date   NO PAST SURGERIES      OB History    Gravida  1   Para  1   Term  1   Preterm  0   AB  0   Living  1     SAB  0   TAB  0   Ectopic  0   Multiple  0   Live Births  1            Home Medications    Prior to Admission medications   Medication Sig Start Date End Date Taking? Authorizing Provider  Sod Fluoride-Potassium Nitrate (PREVIDENT 5000 SENSITIVE) 1.1-5 % PSTE BRUSH WITH TWICE DAILY 07/16/17  Yes [provider]  Benzocaine-Menthol (CEPACOL) 15-2.3 MG LOZG Use as directed 1 lozenge in the mouth or throat as needed. 08/30/19   Chase Picket, MD  [place] 28 0.25-35 MG-MCG tablet TAKE 1 TABLET BY MOUTH EVERY DAY 02/28/19 08/30/19  Trinna Post, PA-C    Family History Family History  Problem Relation Age of Onset   Parkinson's disease Mother    Arthritis/Rheumatoid Mother    Rheum  arthritis Mother    Melanoma Mother    Ovarian cancer Maternal Grandmother    Cancer Paternal Grandmother        liver   Melanoma Sister    Heart disease Paternal Grandfather    Melanoma Paternal Grandfather    Alcohol abuse Neg Hx    Arthritis Neg Hx    Asthma Neg Hx    Birth defects Neg Hx    COPD Neg Hx    Depression Neg Hx    Diabetes Neg Hx    Drug abuse Neg Hx    Early death Neg Hx    Hearing loss Neg Hx    Hyperlipidemia Neg Hx    Hypertension Neg Hx    Kidney disease Neg Hx    Learning disabilities Neg Hx    Mental illness Neg Hx    Mental retardation Neg Hx    Miscarriages / Stillbirths Neg Hx    Stroke Neg Hx    Vision loss Neg Hx    Varicose Veins Neg Hx     Social History Social History   Tobacco Use   Smoking status: Never Smoker  Smokeless tobacco: Never Used  Vaping Use   Vaping Use: Never used  Substance Use Topics   Alcohol use: No   Drug use: No     Allergies   Latex   Review of Systems Review of Systems  Constitutional: Negative.   HENT: Positive for sore throat. Negative for congestion and postnasal drip.   Eyes: Negative.   Respiratory: Negative for chest tightness and shortness of breath.   Gastrointestinal: Negative for abdominal pain, nausea and vomiting.  Musculoskeletal: Negative.   Skin: Negative.   Neurological: Negative for dizziness, numbness and headaches.     Physical Exam Triage Vital Signs ED Triage Vitals  Enc Vitals Group     BP 08/30/19 0819 133/83     Pulse Rate 08/30/19 0819 81     Resp 08/30/19 0819 16     Temp 08/30/19 0819 98.3 F (36.8 C)     Temp Source 08/30/19 0819 Oral     SpO2 08/30/19 0819 100 %     Weight 08/30/19 0817 225 lb (102.1 kg)     Height 08/30/19 0817 5\' 5"  (1.651 m)     Head Circumference --      Peak Flow --      Pain Score 08/30/19 0817 8     Pain Loc --      Pain Edu? --      Excl. in [place]? --    No data found.  Updated Vital Signs BP 133/83  (BP Location: Right Arm)    Pulse 81    Temp 98.3 F (36.8 C) (Oral)    Resp 16    Ht 5\' 5"  (1.651 m)    Wt 102.1 kg    SpO2 100%    BMI 37.44 kg/m   Visual Acuity Right Eye Distance:   Left Eye Distance:   Bilateral Distance:    Right Eye Near:   Left Eye Near:    Bilateral Near:     Physical Exam Vitals and nursing note reviewed.  Constitutional:      General: She is not in acute distress.    Appearance: She is not ill-appearing.  HENT:     Right Ear: Tympanic membrane normal. No drainage or swelling.     Left Ear: Tympanic membrane normal. No drainage, swelling or tenderness.     Mouth/Throat:     Mouth: Mucous membranes are moist.     Pharynx: Posterior oropharyngeal erythema present. No oropharyngeal exudate or uvula swelling.     Tonsils: 0 on the right. 0 on the left.  Eyes:     Conjunctiva/sclera: Conjunctivae normal.  Cardiovascular:     Rate and Rhythm: Normal rate and regular rhythm.     Heart sounds: Normal heart sounds.      UC Treatments / Results  Labs (all labs ordered are listed, but only abnormal results are displayed) Labs Reviewed  GROUP A STREP BY PCR  SARS CORONAVIRUS 2 (TAT 6-24 HRS)    EKG   Radiology No results found.  Procedures Procedures (including critical care time)  Medications Ordered in UC Medications - No data to display  Initial Impression / Assessment and Plan / UC Course  I have reviewed the triage vital signs and the nursing notes.  Pertinent labs & imaging results that were available during my care of the patient were reviewed by me and considered in my medical decision making (see chart for details).     1.  Acute viral pharyngitis: Rapid strep is negative  COVID-19 PCR test has been sent Patient is advised to quarantine until COVID-19 test results are available Warm salt water gargle Tylenol/Motrin for pain Return precautions given. Final Clinical Impressions(s) / UC Diagnoses   Final diagnoses:  Viral  pharyngitis     Discharge Instructions     Please use warm salt water gargle Cepacol lozenges as needed Tylenol/Motrin for pain If you develop any fever or chills please return to urgent care to be reevaluated.    ED Prescriptions    Medication Sig Dispense Auth. Provider   Benzocaine-Menthol (CEPACOL) 15-2.3 MG LOZG Use as directed 1 lozenge in the mouth or throat as needed. 30 lozenge Litsy Epting, Myrene Galas, MD     PDMP not reviewed this encounter.   Chase Picket, MD 08/30/19 [phone]

## 2019-08-30 NOTE — Discharge Instructions (Addendum)
Please use warm salt water gargle Cepacol lozenges as needed Tylenol/Motrin for pain If you develop any fever or chills please return to urgent care to be reevaluated.

## 2019-08-31 LAB — SARS CORONAVIRUS 2 (TAT 6-24 HRS): SARS Coronavirus 2: NEGATIVE

## 2021-01-15 ENCOUNTER — Telehealth: Payer: BC Managed Care – PPO | Admitting: Nurse Practitioner

## 2021-01-15 DIAGNOSIS — J40 Bronchitis, not specified as acute or chronic: Secondary | ICD-10-CM | POA: Diagnosis not present

## 2021-01-15 MED ORDER — AZITHROMYCIN 250 MG PO TABS: ORAL_TABLET | ORAL | 0 refills | Status: AC

## 2021-01-15 MED ORDER — PREDNISONE 20 MG PO TABS
40.0000 mg | ORAL_TABLET | Freq: Every day | ORAL | 0 refills | Status: AC
Start: 2021-01-15 — End: 2021-01-20

## 2021-01-15 MED ORDER — PROMETHAZINE-DM 6.25-15 MG/5ML PO SYRP
5.0000 mL | ORAL_SOLUTION | Freq: Four times a day (QID) | ORAL | 0 refills | Status: AC | PRN
Start: 2021-01-15 — End: ?

## 2021-01-15 NOTE — Progress Notes (Signed)
Virtual Visit Consent   Dana Hatfield, you are scheduled for a virtual visit with Dana Hatfield, [place], a [facility] provider, today.     Just as with appointments in the office, your consent must be obtained to participate.  Your consent will be active for this visit and any virtual visit you may have with one of our providers in the next 365 days.     If you have a MyChart account, a copy of this consent can be sent to you electronically.  All virtual visits are billed to your insurance company just like a traditional visit in the office.    As this is a virtual visit, video technology does not allow for your provider to perform a traditional examination.  This may limit your provider's ability to fully assess your condition.  If your provider identifies any concerns that need to be evaluated in person or the need to arrange testing (such as labs, EKG, etc.), we will make arrangements to do so.     Although advances in technology are sophisticated, we cannot ensure that it will always work on either your end or our end.  If the connection with a video visit is poor, the visit may have to be switched to a telephone visit.  With either a video or telephone visit, we are not always able to ensure that we have a secure connection.     I need to obtain your verbal consent now.   Are you willing to proceed with your visit today? YES   Dana Hatfield has provided verbal consent on 01/15/2021 for a virtual visit (video or telephone).   Dana Hassell Done, FNP   Date: 01/15/2021 11:02 AM   Virtual Visit via Video Note   I, Dana Hatfield, connected with Dana Hatfield ([id number], June 29, 1987) on 01/15/21 at 11:45 AM EDT by a video-enabled telemedicine application and verified that I am speaking with the correct person using two identifiers.  Location: Patient: Virtual Visit Location Patient: Home Provider: Virtual Visit Location Provider: Mobile   I discussed the limitations of  evaluation and management by telemedicine and the availability of in person appointments. The patient expressed understanding and agreed to proceed.    History of Present Illness: Dana Hatfield is a 35 y.o. who identifies as a female who was assigned female at birth, and is being seen today for cough.  HPI: Patient states she has had a cough for over a week. Has actually gotten worse. Cough is productive and barky.  Review of Systems  Constitutional:  Negative for chills and fever.  HENT:  Negative for congestion.   Respiratory:  Positive for cough (barky sounding) and sputum production. Negative for shortness of breath.   Musculoskeletal:  Negative for myalgias.  Neurological:  Negative for headaches.   Problems:  Patient Active Problem List   Diagnosis Date Noted   Postpartum endometritis 08/15/2015   Pregnancy 08/10/2015   Indication for care in labor or delivery 08/09/2015    Allergies:  Allergies  Allergen Reactions   Latex Itching   Medications:  Current Outpatient Medications:    Benzocaine-Menthol (CEPACOL) 15-2.3 MG LOZG, Use as directed 1 lozenge in the mouth or throat as needed., Disp: 30 lozenge, Rfl: 0   Sod Fluoride-Potassium Nitrate (PREVIDENT 5000 SENSITIVE) 1.1-5 % PSTE, BRUSH WITH TWICE DAILY, Disp: , Rfl:   Observations/Objective: Patient is well-developed, well-nourished in no acute distress.  Resting comfortably  at home.  Head is normocephalic, atraumatic.  No labored breathing.  Speech  is clear and coherent with logical content.  Patient is alert and oriented at baseline.  Barky cough during visit  Assessment and Plan:  Dana Hatfield in today with chief complaint of No chief complaint on file.   1. Bronchitis Force fluids Run humidifier Motrin or tylenol for fever or body aches  Meds ordered this encounter  Medications   azithromycin (ZITHROMAX Z-PAK) 250 MG tablet    Sig: As directed    Dispense:  6 tablet    Refill:  0    Order Specific  Question:   Supervising Provider    Answer:   Sabra Heck, BRIAN [[id number]]   predniSONE (DELTASONE) 20 MG tablet    Sig: Take 2 tablets (40 mg total) by mouth daily with breakfast for 5 days. 2 po daily for 5 days    Dispense:  10 tablet    Refill:  0    Order Specific Question:   Supervising Provider    Answer:   Noemi Chapel [[id number]]   promethazine-dextromethorphan (PROMETHAZINE-DM) 6.25-15 MG/5ML syrup    Sig: Take 5 mLs by mouth 4 (four) times daily as needed for cough.    Dispense:  118 mL    Refill:  0    Order Specific Question:   Supervising Provider    Answer:   Noemi Chapel [[id number]]       Follow Up Instructions: I discussed the assessment and treatment plan with the patient. The patient was provided an opportunity to ask questions and all were answered. The patient agreed with the plan and demonstrated an understanding of the instructions.  A copy of instructions were sent to the patient via MyChart.  The patient was advised to call back or seek an in-person evaluation if the symptoms worsen or if the condition fails to improve as anticipated.  Time:  I spent 10 minutes with the patient via telehealth technology discussing the above problems/concerns.    Dana Hassell Done, FNP

## 2021-01-15 NOTE — Patient Instructions (Signed)
Acute Bronchitis, Adult °Acute bronchitis is sudden inflammation of the main airways (bronchi) that come off the windpipe (trachea) in the lungs. The swelling causes the airways to get smaller and make more mucus than normal. This can make it hard to breathe and can cause coughing or noisy breathing (wheezing). °Acute bronchitis may last several weeks. The cough may last longer. Allergies, asthma, and exposure to smoke may make the condition worse. °What are the causes? °This condition can be caused by germs and by substances that irritate the lungs, including: °Cold and flu viruses. The most common cause of this condition is the virus that causes the common cold. °Bacteria. This is less common. °Breathing in substances that irritate the lungs, including: °Smoke from cigarettes and other forms of tobacco. °Dust and pollen. °Fumes from household cleaning products, gases, or burned fuel. °Indoor or outdoor air pollution. °What increases the risk? °The following factors may make you more likely to develop this condition: °A weak body's defense system, also called the immune system. °A condition that affects your lungs and breathing, such as asthma. °What are the signs or symptoms? °Common symptoms of this condition include: °Coughing. This may bring up clear, yellow, or green mucus from your lungs (sputum). °Wheezing. °Runny or stuffy nose. °Having too much mucus in your lungs (chest congestion). °Shortness of breath. °Aches and pains, including sore throat or chest. °How is this diagnosed? °This condition is usually diagnosed based on: °Your symptoms and medical history. °A physical exam. °You may also have other tests, including tests to rule out other conditions, such as pneumonia. These tests include: °A test of lung function. °Test of a mucus sample to look for the presence of bacteria. °Tests to check the oxygen level in your blood. °Blood tests. °Chest X-ray. °How is this treated? °Most cases of acute bronchitis  clear up over time without treatment. Your health care provider may recommend: °Drinking more fluids to help thin your mucus so it is easier to cough up. °Taking inhaled medicine (inhaler) to improve air flow in and out of your lungs. °Using a vaporizer or a humidifier. These are machines that add water to the air to help you breathe better. °Taking a medicine that thins mucus and clears congestion (expectorant). °Taking a medicine that prevents or stops coughing (cough suppressant). °It is notcommon to take an antibiotic medicine for this condition. °Follow these instructions at home: ° °Take over-the-counter and prescription medicines only as told by your health care provider. °Use an inhaler, vaporizer, or humidifier as told by your health care provider. °Take two teaspoons (10 mL) of honey at bedtime to lessen coughing at night. °Drink enough fluid to keep your urine pale yellow. °Do not use any products that contain nicotine or tobacco. These products include cigarettes, chewing tobacco, and vaping devices, such as e-cigarettes. If you need help quitting, ask your health care provider. °Get plenty of rest. °Return to your normal activities as told by your health care provider. Ask your health care provider what activities are safe for you. °Keep all follow-up visits. This is important. °How is this prevented? °To lower your risk of getting this condition again: °Wash your hands often with soap and water for at least 20 seconds. If soap and water are not available, use hand sanitizer. °Avoid contact with people who have cold symptoms. °Try not to touch your mouth, nose, or eyes with your hands. °Avoid breathing in smoke or chemical fumes. Breathing smoke or chemical fumes will make your condition   worse. °Get the flu shot every year. °Contact a health care provider if: °Your symptoms do not improve after 2 weeks. °You have trouble coughing up the mucus. °Your cough keeps you awake at night. °You have a  fever. °Get help right away if you: °Cough up blood. °Feel pain in your chest. °Have severe shortness of breath. °Faint or keep feeling like you are going to faint. °Have a severe headache. °Have a fever or chills that get worse. °These symptoms may represent a serious problem that is an emergency. Do not wait to see if the symptoms will go away. Get medical help right away. Call your local emergency services (911 in the [place] Do not drive yourself to the hospital. °Summary °Acute bronchitis is inflammation of the main airways (bronchi) that come off the windpipe (trachea) in the lungs. The swelling causes the airways to get smaller and make more mucus than normal. °Drinking more fluids can help thin your mucus so it is easier to cough up. °Take over-the-counter and prescription medicines only as told by your health care provider. °Do not use any products that contain nicotine or tobacco. These products include cigarettes, chewing tobacco, and vaping devices, such as e-cigarettes. If you need help quitting, ask your health care provider. °Contact a health care provider if your symptoms do not improve after 2 weeks. °This information is not intended to replace advice given to you by your health care provider. Make sure you discuss any questions you have with your health care provider. °Document Revised: 06/30/2020 Document Reviewed: 06/30/2020 °Elsevier Patient Education © 2022 [employer] ° °

## 2021-04-10 ENCOUNTER — Telehealth: Payer: BC Managed Care – PPO | Admitting: Nurse Practitioner

## 2021-04-10 DIAGNOSIS — J019 Acute sinusitis, unspecified: Secondary | ICD-10-CM | POA: Diagnosis not present

## 2021-04-10 DIAGNOSIS — B9789 Other viral agents as the cause of diseases classified elsewhere: Secondary | ICD-10-CM | POA: Diagnosis not present

## 2021-04-10 MED ORDER — PSEUDOEPH-BROMPHEN-DM 30-2-10 MG/5ML PO SYRP: 5.0000 mL | ORAL_SOLUTION | Freq: Four times a day (QID) | ORAL | 0 refills | Status: AC | PRN

## 2021-04-10 MED ORDER — FLUTICASONE PROPIONATE 50 MCG/ACT NA SUSP: 2.0000 | Freq: Every day | NASAL | 0 refills | Status: AC

## 2021-04-10 NOTE — Progress Notes (Signed)
I have spent 5 minutes in review of e-visit questionnaire, review and updating patient chart, medical decision making and response to patient.  ° °Zyliah Schier W Hina Gupta, NP ° °  °

## 2021-04-10 NOTE — Progress Notes (Signed)
E-Visit for Sinus Problems  We are sorry that you are not feeling well.  Here is how we plan to help!  Based on what you have shared with me it looks like you have sinusitis.  Sinusitis is inflammation and infection in the sinus cavities of the head.  Based on your presentation I believe you most likely have Acute Viral Sinusitis.This is an infection most likely caused by a virus. There is not specific treatment for viral sinusitis other than to help you with the symptoms until the infection runs its course.  You may use an oral decongestant such as Mucinex D or if you have glaucoma or high blood pressure use plain Mucinex. Saline nasal spray help and can safely be used as often as needed for congestion, I have prescribed: Fluticasone nasal spray two sprays in each nostril once a day and a cough syrup for your cough.  Some authorities believe that zinc sprays or the use of Echinacea may shorten the course of your symptoms.  Sinus infections are not as easily transmitted as other respiratory infection, however we still recommend that you avoid close contact with loved ones, especially the very young and elderly.  Remember to wash your hands thoroughly throughout the day as this is the number one way to prevent the spread of infection!  Home Care: Only take medications as instructed by your medical team. Do not take these medications with alcohol. A steam or ultrasonic humidifier can help congestion.  You can place a towel over your head and breathe in the steam from hot water coming from a faucet. Avoid close contacts especially the very young and the elderly. Cover your mouth when you cough or sneeze. Always remember to wash your hands.  Get Help Right Away If: You develop worsening fever or sinus pain. You develop a severe head ache or visual changes. Your symptoms persist after you have completed your treatment plan.  Make sure you Understand these instructions. Will watch your  condition. Will get help right away if you are not doing well or get worse.   Thank you for choosing an e-visit.  Your e-visit answers were reviewed by a board certified advanced clinical practitioner to complete your personal care plan. Depending upon the condition, your plan could have included both over the counter or prescription medications.  Please review your pharmacy choice. Make sure the pharmacy is open so you can pick up prescription now. If there is a problem, you may contact your provider through [employer] and have the prescription routed to another pharmacy.  Your safety is important to [place]. If you have drug allergies check your prescription carefully.   For the next 24 hours you can use MyChart to ask questions about today's visit, request a non-urgent call back, or ask for a work or school excuse. You will get an email in the next two days asking about your experience. I hope that your e-visit has been valuable and will speed your recovery.
